# Patient Record
Sex: Female | Born: 1975 | Race: Black or African American | Hispanic: No | Marital: Married | Smoking: Never smoker
Health system: Southern US, Community
[De-identification: ages and names within clinical notes are randomized; demographics above are authoritative.]

## PROBLEM LIST (undated history)

## (undated) DIAGNOSIS — D696 Thrombocytopenia, unspecified: Secondary | ICD-10-CM

## (undated) DIAGNOSIS — A749 Chlamydial infection, unspecified: Secondary | ICD-10-CM

## (undated) DIAGNOSIS — A549 Gonococcal infection, unspecified: Secondary | ICD-10-CM

## (undated) DIAGNOSIS — F419 Anxiety disorder, unspecified: Secondary | ICD-10-CM

## (undated) DIAGNOSIS — A491 Streptococcal infection, unspecified site: Secondary | ICD-10-CM

## (undated) HISTORY — PX: WISDOM TOOTH EXTRACTION: SHX21

## (undated) HISTORY — DX: Streptococcal infection, unspecified site: A49.1

## (undated) HISTORY — PX: INGUINAL HERNIA REPAIR: SUR1180

---

## 1998-10-27 ENCOUNTER — Encounter (HOSPITAL_COMMUNITY): Admission: RE | Admit: 1998-10-27 | Discharge: 1999-01-25 | Payer: Self-pay | Admitting: Family Medicine

## 2009-07-21 ENCOUNTER — Ambulatory Visit: Payer: Self-pay | Admitting: Gynecology

## 2009-07-21 ENCOUNTER — Other Ambulatory Visit: Admission: RE | Admit: 2009-07-21 | Discharge: 2009-07-21 | Payer: Self-pay | Admitting: Gynecology

## 2010-06-19 DIAGNOSIS — A491 Streptococcal infection, unspecified site: Secondary | ICD-10-CM

## 2010-06-19 HISTORY — DX: Streptococcal infection, unspecified site: A49.1

## 2010-08-27 LAB — ABO/RH: RH Type: POSITIVE

## 2010-08-27 LAB — CBC
HCT: 39 % (ref 36–46)
Hemoglobin: 12.5 g/dL (ref 12.0–16.0)

## 2010-08-27 LAB — HIV ANTIBODY (ROUTINE TESTING W REFLEX): HIV: NONREACTIVE

## 2010-08-27 LAB — RPR: RPR: NONREACTIVE

## 2011-02-22 LAB — STREP B DNA PROBE: GBS: POSITIVE

## 2011-03-24 ENCOUNTER — Encounter (HOSPITAL_COMMUNITY): Payer: Self-pay | Admitting: *Deleted

## 2011-03-24 ENCOUNTER — Telehealth (HOSPITAL_COMMUNITY): Payer: Self-pay | Admitting: *Deleted

## 2011-03-24 ENCOUNTER — Inpatient Hospital Stay (HOSPITAL_COMMUNITY)
Admission: AD | Admit: 2011-03-24 | Discharge: 2011-03-24 | Disposition: A | Discharge status: Home / Self Care | Payer: BC Managed Care – PPO | Source: Ambulatory Visit | Attending: Obstetrics and Gynecology | Admitting: Obstetrics and Gynecology

## 2011-03-24 DIAGNOSIS — D696 Thrombocytopenia, unspecified: Secondary | ICD-10-CM

## 2011-03-24 DIAGNOSIS — O99820 Streptococcus B carrier state complicating pregnancy: Secondary | ICD-10-CM

## 2011-03-24 DIAGNOSIS — Z22330 Carrier of Group B streptococcus: Secondary | ICD-10-CM | POA: Insufficient documentation

## 2011-03-24 DIAGNOSIS — R030 Elevated blood-pressure reading, without diagnosis of hypertension: Secondary | ICD-10-CM | POA: Insufficient documentation

## 2011-03-24 DIAGNOSIS — O99891 Other specified diseases and conditions complicating pregnancy: Secondary | ICD-10-CM | POA: Insufficient documentation

## 2011-03-24 HISTORY — DX: Gonococcal infection, unspecified: A54.9

## 2011-03-24 HISTORY — DX: Chlamydial infection, unspecified: A74.9

## 2011-03-24 HISTORY — DX: Anxiety disorder, unspecified: F41.9

## 2011-03-24 LAB — POCT FERN TEST: Fern Test: NEGATIVE

## 2011-03-24 NOTE — ED Provider Notes (Signed)
History   35 yo G1P0 at 40 2/7 weeks presented c/o one episode wetness upon awakening this am.  Denies current leaking or bleeding, no recent IC.  No UCs, reports +FM.  Denies HA, visual symptoms, epigastric pain.  Is scheduled for induction on 10/10 in the am (no other slots available for cervical ripening).  Pregnancy remarkable for: + GBS Hx mild thrombocytopenia--platelets 129 at NOB, to nadir of 108 at 29 weeks.  Most recently 116 on 10/2, stable at 110-116 in last 4 weeks. 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 Labor Eval     OB History    Grav Para Term Preterm Abortions TAB SAB Ect Mult Living   1               Past Medical History  Diagnosis Date  . Chlamydia   . Gonorrhea   . Asthma     Seasonal, exercise induced  . Anxiety     Past Surgical History  Procedure Date  . Inguinal hernia repair   . Wisdom tooth extraction    FHx:  MGF heart disease; Mother/father HTN; Cousin Asthma; MGF, PGF, PAs, PUs diabetes; Cousin epilepsy and migraines; MGF prostate ca; PA anxiety; FH etoh abuse.  History  Substance Use Topics  . Smoking status: Never Smoker   . Smokeless tobacco: Never Used  . Alcohol Use: No    Allergies: Phenergan--dyskinesia  Prescriptions prior to admission  Medication Sig Dispense Refill  . ALBUTEROL SULFATE IN Inhale 2 puffs into the lungs daily as needed. For shortness of breath.       . DiphenhydrAMINE HCl (BENADRYL PO) Take 1 tablet by mouth daily as needed. For insomnia.       Marland Kitchen levocetirizine (XYZAL) 5 MG tablet Take 5 mg by mouth every evening.        . prenatal vitamin w/FE, FA (PRENATAL 1 + 1) 27-1 MG TABS Take 1 tablet by mouth daily.          ROS:  Denies SOB, chest pain, HA, epigastric pain, contractions, bleeding.   Physical Exam   Blood pressure 129/89, pulse 102, temperature 98.2 F (36.8 C), temperature source Oral, resp. rate 20, height 6\' 1"  (1.854 m), weight 101.606  kg (224 lb).  Chest clear Heart RRR without murmur Abd gravid, NT, no epigastric pain Pelvic--no pooling, small amount creamy vaginal discharge in vault.  Fern negative.             Cervix posterior, FT, 50%, vtx, -2. Amnisure obtained. Ext--DTR 1+ without clonus, tr edema FHR reactive, no decels Occasional mild irritability  ED Course  IUP at 40 2/7 weeks R/O SROM Mild elevation of BP x 1 on arrival. Hx mild thrombocytopenia througout pregnancy Unfavorable cervix + GBS  Plan: Await amnisure. Serial BPs.  Nigel Bridgeman, CNM 03/24/11 1150

## 2011-03-24 NOTE — Telephone Encounter (Signed)
Preadmission screen  

## 2011-03-24 NOTE — ED Provider Notes (Signed)
Amnisure negative. FHR reactive in cycles. No UCs. BP 122/78.  Re-scheduled induction for now-available evening slot on Monday, March 27, 2011, at 7:30 pm for cervical ripening.  Labor precautions reviewed. Induction process discussed.  D/C'd home. Will follow-up as scheduled or prn.  Nigel Bridgeman, CNM 03/24/11 1215

## 2011-03-24 NOTE — Progress Notes (Signed)
Woke up this morning with wet underwear and shorts. Has not had any since.

## 2011-03-27 ENCOUNTER — Other Ambulatory Visit: Payer: Self-pay | Admitting: Obstetrics and Gynecology

## 2011-03-27 ENCOUNTER — Encounter (HOSPITAL_COMMUNITY): Payer: Self-pay

## 2011-03-27 ENCOUNTER — Inpatient Hospital Stay (HOSPITAL_COMMUNITY)
Admission: AD | Admit: 2011-03-27 | Discharge: 2011-03-30 | DRG: 373 | Disposition: A | Discharge status: Home / Self Care | Payer: BC Managed Care – PPO | Source: Ambulatory Visit | Attending: Obstetrics and Gynecology | Admitting: Obstetrics and Gynecology

## 2011-03-27 ENCOUNTER — Encounter (HOSPITAL_COMMUNITY): Payer: Self-pay | Admitting: *Deleted

## 2011-03-27 DIAGNOSIS — Z22330 Carrier of Group B streptococcus: Secondary | ICD-10-CM

## 2011-03-27 DIAGNOSIS — O480 Post-term pregnancy: Principal | ICD-10-CM | POA: Diagnosis present

## 2011-03-27 DIAGNOSIS — D689 Coagulation defect, unspecified: Secondary | ICD-10-CM | POA: Diagnosis present

## 2011-03-27 DIAGNOSIS — D696 Thrombocytopenia, unspecified: Secondary | ICD-10-CM

## 2011-03-27 DIAGNOSIS — O99892 Other specified diseases and conditions complicating childbirth: Secondary | ICD-10-CM | POA: Diagnosis present

## 2011-03-27 DIAGNOSIS — O99820 Streptococcus B carrier state complicating pregnancy: Secondary | ICD-10-CM

## 2011-03-27 HISTORY — DX: Thrombocytopenia, unspecified: D69.6

## 2011-03-27 LAB — CBC
MCH: 33 pg (ref 26.0–34.0)
MCHC: 34.6 g/dL (ref 30.0–36.0)
Platelets: 117 10*3/uL — ABNORMAL LOW (ref 150–400)
RDW: 13.3 % (ref 11.5–15.5)

## 2011-03-27 LAB — COMPREHENSIVE METABOLIC PANEL
ALT: 21 U/L (ref 0–35)
AST: 58 U/L — ABNORMAL HIGH (ref 0–37)
Albumin: 3.1 g/dL — ABNORMAL LOW (ref 3.5–5.2)
Calcium: 10.1 mg/dL (ref 8.4–10.5)
GFR calc Af Amer: >90 mL/min (ref >90)
Sodium: 134 mEq/L — ABNORMAL LOW (ref 135–145)
Total Protein: 6.5 g/dL (ref 6.0–8.3)

## 2011-03-27 LAB — URIC ACID: Uric Acid, Serum: 4.2 mg/dL (ref 2.4–7.0)

## 2011-03-27 MED ORDER — SODIUM CHLORIDE 0.9 % IJ SOLN: 3.0000 mL | Freq: Two times a day (BID) | INTRAMUSCULAR | Status: DC

## 2011-03-27 MED ORDER — NALBUPHINE SYRINGE 5 MG/0.5 ML
10.0000 mg | INJECTION | INTRAMUSCULAR | Status: DC | PRN
  Administered 2011-03-28: 10 mg via INTRAVENOUS
  Filled 2011-03-27 (×2): qty 1

## 2011-03-27 MED ORDER — PENICILLIN G POTASSIUM 5000000 UNITS IJ SOLR
2.5000 10*6.[IU] | INTRAVENOUS | Status: DC
  Administered 2011-03-28 (×2): 2.5 10*6.[IU] via INTRAVENOUS
  Filled 2011-03-27 (×8): qty 2.5

## 2011-03-27 MED ORDER — LIDOCAINE HCL (PF) 1 % IJ SOLN
30.0000 mL | INTRAMUSCULAR | Status: DC | PRN
  Filled 2011-03-27 (×2): qty 30

## 2011-03-27 MED ORDER — FLEET ENEMA 7-19 GM/118ML RE ENEM: 1.0000 | ENEMA | RECTAL | Status: DC | PRN

## 2011-03-27 MED ORDER — ONDANSETRON HCL 4 MG/2ML IJ SOLN
4.0000 mg | Freq: Four times a day (QID) | INTRAMUSCULAR | Status: DC | PRN
Start: 2011-03-27 — End: 2011-03-29

## 2011-03-27 MED ORDER — IBUPROFEN 600 MG PO TABS
600.0000 mg | ORAL_TABLET | Freq: Four times a day (QID) | ORAL | Status: DC | PRN
  Administered 2011-03-28: 600 mg via ORAL
  Filled 2011-03-27 (×3): qty 1

## 2011-03-27 MED ORDER — OXYCODONE-ACETAMINOPHEN 5-325 MG PO TABS: 2.0000 | ORAL_TABLET | ORAL | Status: DC | PRN

## 2011-03-27 MED ORDER — DINOPROSTONE 10 MG VA INST
10.0000 mg | VAGINAL_INSERT | Freq: Once | VAGINAL | Status: AC
  Administered 2011-03-27: 10 mg via VAGINAL
  Filled 2011-03-27: qty 1

## 2011-03-27 MED ORDER — DIPHENHYDRAMINE HCL 25 MG PO CAPS
50.0000 mg | ORAL_CAPSULE | Freq: Every evening | ORAL | Status: DC | PRN
  Administered 2011-03-27: 50 mg via ORAL
  Filled 2011-03-27: qty 2

## 2011-03-27 MED ORDER — PENICILLIN G POTASSIUM 5000000 UNITS IJ SOLR
5.0000 10*6.[IU] | Freq: Once | INTRAVENOUS | Status: AC
  Administered 2011-03-28: 5 10*6.[IU] via INTRAVENOUS
  Filled 2011-03-27: qty 5

## 2011-03-27 MED ORDER — CITRIC ACID-SODIUM CITRATE 334-500 MG/5ML PO SOLN: 30.0000 mL | ORAL | Status: DC | PRN

## 2011-03-27 MED ORDER — OXYTOCIN 20 UNITS IN LACTATED RINGERS INFUSION - SIMPLE
1.0000 m[IU]/min | INTRAVENOUS | Status: DC
  Filled 2011-03-27: qty 1000

## 2011-03-27 MED ORDER — OXYTOCIN BOLUS FROM INFUSION
500.0000 mL | Freq: Once | INTRAVENOUS | Status: DC
  Filled 2011-03-27: qty 500

## 2011-03-27 MED ORDER — ACETAMINOPHEN 325 MG PO TABS: 650.0000 mg | ORAL_TABLET | ORAL | Status: DC | PRN

## 2011-03-27 MED ORDER — SODIUM CHLORIDE 0.9 % IV SOLN: 250.0000 mL | INTRAVENOUS | Status: DC

## 2011-03-27 MED ORDER — OXYTOCIN 20 UNITS IN LACTATED RINGERS INFUSION - SIMPLE
125.0000 mL/h | Freq: Once | INTRAVENOUS | Status: AC
  Administered 2011-03-28: 999 mL/h via INTRAVENOUS

## 2011-03-27 MED ORDER — SODIUM CHLORIDE 0.9 % IJ SOLN: 3.0000 mL | INTRAMUSCULAR | Status: DC | PRN

## 2011-03-27 MED ORDER — LACTATED RINGERS IV SOLN
500.0000 mL | INTRAVENOUS | Status: DC | PRN
  Administered 2011-03-27: 300 mL via INTRAVENOUS
  Administered 2011-03-27: 1000 mL via INTRAVENOUS
  Administered 2011-03-28: 500 mL via INTRAVENOUS

## 2011-03-27 MED ORDER — TERBUTALINE SULFATE 1 MG/ML IJ SOLN: 0.2500 mg | Freq: Once | INTRAMUSCULAR | Status: AC | PRN

## 2011-03-27 MED ORDER — ZOLPIDEM TARTRATE 10 MG PO TABS: 10.0000 mg | ORAL_TABLET | Freq: Every evening | ORAL | Status: DC | PRN

## 2011-03-27 NOTE — H&P (Signed)
Melanie House is a 35 y.o. female G1P0, at 64 5/7 weeks, presenting for induction due to postdates and mild thrombocytopenia, GBS positive.  Denies leaking, reports small amount pink d/c tonight with wiping, reports + FM.    Pregnancy remarkable for: Positive GBS Mild thrombocytopenia Hx STDs in past Seasonal Asthma Anxiety Phenergan allergy  History Entered care at 10 weeks.  Had normal 1st trimester screen and AFP.  Normal [place] at 18 weeks , with anterior placenta.  Platelet count slightly low at NOB, repeated at 28 weeks=113.  Followed after that at nearly each visit, with 108 as nadir at 29 weeks.  Last value was 116 on 10/2.  Evaluated 10/5 for ?ROM, with negative amnisure. OB History    Grav Para Term Preterm Abortions TAB SAB Ect Mult Living   1              Past Medical History  Diagnosis Date  . Chlamydia   . Gonorrhea   . Anxiety   . Asthma    Past Surgical History  Procedure Date  . Inguinal hernia repair   . Wisdom tooth extraction    Family History: family history includes Alcohol abuse in her maternal aunt, maternal grandfather, maternal uncle, and paternal grandfather; Asthma in her cousin; Cancer in her maternal grandfather; Diabetes in her maternal grandmother, paternal aunt, paternal grandfather, and paternal uncle; Heart disease in her maternal grandfather; Hypertension in her father and mother; Migraines in her cousin; and Seizures in her cousin.  There is no history of Anesthesia problems, and Hypotension, and Malignant hyperthermia, and Pseudochol deficiency, . Social History:  reports that she has never smoked. She has never used smokeless tobacco. She reports that she does not drink alcohol or use illicit drugs.  FOB is involved and supportive Marcie Bal), with a high-school education and employed in customer service.  Patient is graduate educated, and employed in Research scientist (physical sciences).  She is African-American and of the [place] faith.  She has been  followed by the CNM service.  Review of Systems  Constitutional: Negative.   HENT: Negative.   Eyes: Negative.   Respiratory: Negative.   Cardiovascular: Negative.   Gastrointestinal: Negative.   Genitourinary: Negative.   Musculoskeletal: Negative.   Skin: Negative.   Neurological: Negative.   Endo/Heme/Allergies: Negative.   Psychiatric/Behavioral: Negative.     Dilation: Fingertip Effacement (%): 50 Station: -1 Exam by:: Nigel Bridgeman, CNM Height 6\' 1"  (1.854 m), weight 102.513 kg (226 lb). Maternal Exam:  Uterine Assessment: Contraction strength is mild.  Contraction frequency is rare.   Abdomen: Patient reports no abdominal tenderness. Fundal height is 39 cm.   Estimated fetal weight is 8-8 1/2 obs.   Fetal presentation: vertex  Introitus: Normal vulva. Normal vagina.    Physical Exam  Constitutional: She is oriented to person, place, and time. She appears well-developed and well-nourished.  HENT:  Head: Normocephalic.  Eyes: Pupils are equal, round, and reactive to light.  Neck: Normal range of motion. Neck supple.  Cardiovascular: Normal rate and regular rhythm.   Respiratory: Effort normal and breath sounds normal.  GI: Soft. Bowel sounds are normal.  Genitourinary: Vagina normal and uterus normal.  Musculoskeletal: Normal range of motion.  Neurological: She is alert and oriented to person, place, and time.  Skin: Skin is warm and dry.  Psychiatric: She has a normal mood and affect. Her behavior is normal. Judgment and thought content normal.    Prenatal labs: ABO, Rh: O/Positive/-- (03/10 0000) Antibody: Negative (03/10  0000) Rubella: Immune (03/10 0000) RPR: Nonreactive (03/10 0000)  HBsAg: Negative (03/10 0000)  HIV: Non-reactive (03/10 0000)  GBS: Positive (09/05 0000)  1st trimester screen and AFP WNL Glucola WNL See above notes for platelet count Hgb12.8 at 28 weeks GBS positive.  Assessment/Plan: IUP at 40 5/7 weeks Mild  thrombocytopenia GBS positive  Plan: Admit to [facility] per consult with Dr. Normand Sloop Routine CNM orders Plan Cervidil, then pitocin in the am GBS Rx with PCN G per standard regimen, initiate with pitocin or if spontaneous labor ensues before that. Patient plans epidural Await platelet count.   Nigel Bridgeman 03/27/2011, 9:38 PM

## 2011-03-27 NOTE — Progress Notes (Signed)
Comfortable--unaware of any contractions. FHR reassuring, not clearly reactive. Cervidil placed in posterior fornix  Results for orders placed during the hospital encounter of 03/27/11 (from the past 24 hour(s))  CBC     Status: Abnormal (Preliminary result)   Collection Time   03/27/11  9:25 PM      Component Value Range   WBC 6.6  4.0 - 10.5 (K/uL)   RBC 4.61  3.87 - 5.11 (MIL/uL)   Hemoglobin 15.2 (*) 12.0 - 15.0 (g/dL)   HCT 11.9  14.7 - 82.9 (%)   MCV 95.2  78.0 - 100.0 (fL)   MCH 33.0  26.0 - 34.0 (pg)   MCHC 34.6  30.0 - 36.0 (g/dL)   RDW 56.2  13.0 - 86.5 (%)   Platelets PENDING  150 - 400 (K/uL)   Will await platelets. Patient prefers Benadryl to Ambien.  Nigel Bridgeman, CNM 03/27/11 2210

## 2011-03-28 ENCOUNTER — Encounter (HOSPITAL_COMMUNITY): Payer: Self-pay | Admitting: Anesthesiology

## 2011-03-28 ENCOUNTER — Inpatient Hospital Stay (HOSPITAL_COMMUNITY): Payer: BC Managed Care – PPO | Admitting: Anesthesiology

## 2011-03-28 ENCOUNTER — Encounter (HOSPITAL_COMMUNITY): Payer: Self-pay

## 2011-03-28 LAB — CBC
HCT: 41 % (ref 36.0–46.0)
HCT: 37.9 % (ref 36.0–46.0)
Hemoglobin: 14.1 g/dL (ref 12.0–15.0)
MCH: 32.6 pg (ref 26.0–34.0)
MCH: 32.8 pg (ref 26.0–34.0)
MCHC: 34.4 g/dL (ref 30.0–36.0)
MCHC: 34.8 g/dL (ref 30.0–36.0)
MCV: 94.9 fL (ref 78.0–100.0)
MCV: 94 fL (ref 78.0–100.0)
Platelets: 106 10*3/uL — ABNORMAL LOW (ref 150–400)
RBC: 4.32 MIL/uL (ref 3.87–5.11)
RDW: 13 % (ref 11.5–15.5)
WBC: 12.1 10*3/uL — ABNORMAL HIGH (ref 4.0–10.5)

## 2011-03-28 MED ORDER — FENTANYL 2.5 MCG/ML BUPIVACAINE 1/10 % EPIDURAL INFUSION (WH - ANES)
14.0000 mL/h | INTRAMUSCULAR | Status: DC
  Administered 2011-03-28 (×2): 14 mL/h via EPIDURAL
  Filled 2011-03-28 (×3): qty 60

## 2011-03-28 MED ORDER — ZOLPIDEM TARTRATE 5 MG PO TABS: 5.0000 mg | ORAL_TABLET | Freq: Every evening | ORAL | Status: DC | PRN

## 2011-03-28 MED ORDER — EPHEDRINE 5 MG/ML INJ
10.0000 mg | INTRAVENOUS | Status: DC | PRN
  Filled 2011-03-28 (×2): qty 4

## 2011-03-28 MED ORDER — PRENATAL PLUS 27-1 MG PO TABS
1.0000 | ORAL_TABLET | Freq: Every day | ORAL | Status: DC
  Administered 2011-03-29 – 2011-03-30 (×2): 1 via ORAL
  Filled 2011-03-28 (×2): qty 1

## 2011-03-28 MED ORDER — METHYLERGONOVINE MALEATE 0.2 MG PO TABS: 0.2000 mg | ORAL_TABLET | ORAL | Status: DC | PRN

## 2011-03-28 MED ORDER — LACTATED RINGERS IV SOLN: 500.0000 mL | Freq: Once | INTRAVENOUS | Status: DC

## 2011-03-28 MED ORDER — LORATADINE 10 MG PO TABS
10.0000 mg | ORAL_TABLET | Freq: Every evening | ORAL | Status: DC
  Administered 2011-03-28: 10 mg via ORAL
  Filled 2011-03-28: qty 1

## 2011-03-28 MED ORDER — IBUPROFEN 600 MG PO TABS
600.0000 mg | ORAL_TABLET | Freq: Four times a day (QID) | ORAL | Status: DC
  Administered 2011-03-29 – 2011-03-30 (×6): 600 mg via ORAL
  Filled 2011-03-28 (×4): qty 1

## 2011-03-28 MED ORDER — DIPHENHYDRAMINE HCL 25 MG PO CAPS: 25.0000 mg | ORAL_CAPSULE | Freq: Four times a day (QID) | ORAL | Status: DC | PRN

## 2011-03-28 MED ORDER — PHENYLEPHRINE 40 MCG/ML (10ML) SYRINGE FOR IV PUSH (FOR BLOOD PRESSURE SUPPORT)
80.0000 ug | PREFILLED_SYRINGE | INTRAVENOUS | Status: DC | PRN
  Filled 2011-03-28 (×2): qty 5

## 2011-03-28 MED ORDER — POLYSACCHARIDE IRON 150 MG PO CAPS
150.0000 mg | ORAL_CAPSULE | Freq: Two times a day (BID) | ORAL | Status: DC
  Administered 2011-03-29 – 2011-03-30 (×3): 150 mg via ORAL
  Filled 2011-03-28 (×5): qty 1

## 2011-03-28 MED ORDER — DIBUCAINE 1 % RE OINT: 1.0000 "application " | TOPICAL_OINTMENT | RECTAL | Status: DC | PRN

## 2011-03-28 MED ORDER — EPHEDRINE 5 MG/ML INJ
10.0000 mg | INTRAVENOUS | Status: DC | PRN
  Filled 2011-03-28: qty 4

## 2011-03-28 MED ORDER — OXYCODONE-ACETAMINOPHEN 5-325 MG PO TABS: 1.0000 | ORAL_TABLET | ORAL | Status: DC | PRN

## 2011-03-28 MED ORDER — OXYTOCIN 20 UNITS IN LACTATED RINGERS INFUSION - SIMPLE
1.0000 m[IU]/min | INTRAVENOUS | Status: DC
  Administered 2011-03-28: 1 m[IU]/min via INTRAVENOUS
  Filled 2011-03-28: qty 1000

## 2011-03-28 MED ORDER — SENNOSIDES-DOCUSATE SODIUM 8.6-50 MG PO TABS
2.0000 | ORAL_TABLET | Freq: Every day | ORAL | Status: DC
  Administered 2011-03-28 – 2011-03-29 (×2): 2 via ORAL

## 2011-03-28 MED ORDER — TETANUS-DIPHTH-ACELL PERTUSSIS 5-2.5-18.5 LF-MCG/0.5 IM SUSP
0.5000 mL | Freq: Once | INTRAMUSCULAR | Status: AC
  Administered 2011-03-29: 0.5 mL via INTRAMUSCULAR
  Filled 2011-03-28: qty 0.5

## 2011-03-28 MED ORDER — MAGNESIUM HYDROXIDE 400 MG/5ML PO SUSP: 30.0000 mL | ORAL | Status: DC | PRN

## 2011-03-28 MED ORDER — PHENYLEPHRINE 40 MCG/ML (10ML) SYRINGE FOR IV PUSH (FOR BLOOD PRESSURE SUPPORT)
80.0000 ug | PREFILLED_SYRINGE | INTRAVENOUS | Status: DC | PRN
  Filled 2011-03-28: qty 5

## 2011-03-28 MED ORDER — SIMETHICONE 80 MG PO CHEW: 80.0000 mg | CHEWABLE_TABLET | ORAL | Status: DC | PRN

## 2011-03-28 MED ORDER — FENTANYL 2.5 MCG/ML BUPIVACAINE 1/10 % EPIDURAL INFUSION (WH - ANES)
INTRAMUSCULAR | Status: DC | PRN
  Administered 2011-03-28: 14 mL/h via EPIDURAL

## 2011-03-28 MED ORDER — ONDANSETRON HCL 4 MG PO TABS: 4.0000 mg | ORAL_TABLET | ORAL | Status: DC | PRN

## 2011-03-28 MED ORDER — METHYLERGONOVINE MALEATE 0.2 MG/ML IJ SOLN: 0.2000 mg | INTRAMUSCULAR | Status: DC | PRN

## 2011-03-28 MED ORDER — TERBUTALINE SULFATE 1 MG/ML IJ SOLN: 0.2500 mg | Freq: Once | INTRAMUSCULAR | Status: AC | PRN

## 2011-03-28 MED ORDER — LEVOCETIRIZINE DIHYDROCHLORIDE 5 MG PO TABS
5.0000 mg | ORAL_TABLET | Freq: Every evening | ORAL | Status: DC
Start: 2011-03-28 — End: 2011-03-28

## 2011-03-28 MED ORDER — ONDANSETRON HCL 4 MG/2ML IJ SOLN: 4.0000 mg | INTRAMUSCULAR | Status: DC | PRN

## 2011-03-28 MED ORDER — PRENATAL PLUS 27-1 MG PO TABS: 1.0000 | ORAL_TABLET | Freq: Every day | ORAL | Status: DC

## 2011-03-28 MED ORDER — LACTATED RINGERS IV SOLN
INTRAVENOUS | Status: DC
  Administered 2011-03-28 (×2): via INTRAVENOUS

## 2011-03-28 MED ORDER — BENZOCAINE-MENTHOL 20-0.5 % EX AERO
1.0000 "application " | INHALATION_SPRAY | CUTANEOUS | Status: DC | PRN
  Administered 2011-03-29: 1 via TOPICAL

## 2011-03-28 MED ORDER — DIPHENHYDRAMINE HCL 50 MG/ML IJ SOLN: 12.5000 mg | INTRAMUSCULAR | Status: DC | PRN

## 2011-03-28 MED ORDER — WITCH HAZEL-GLYCERIN EX PADS: 1.0000 "application " | MEDICATED_PAD | CUTANEOUS | Status: DC | PRN

## 2011-03-28 MED ORDER — LANOLIN HYDROUS EX OINT: TOPICAL_OINTMENT | CUTANEOUS | Status: DC | PRN

## 2011-03-28 MED ORDER — MEDROXYPROGESTERONE ACETATE 150 MG/ML IM SUSP: 150.0000 mg | INTRAMUSCULAR | Status: DC | PRN

## 2011-03-28 NOTE — Progress Notes (Signed)
Subjective: Pt w/o c/o's.  Asked to be checked again.  Several family members at [facility].  Pitocin d/c'd around 0945 for lates; restarted over last hr for spacing of ctxs.    Objective: BP 143/74  Pulse 103  Temp(Src) 98.5 F (36.9 C) (Oral)  Resp 18  Ht 6\' 1"  (1.854 m)  Wt 102.513 kg (226 lb)  BMI 29.82 kg/m2  SpO2 98%   Total I/O In: -  Out: 150 [Urine:150]  FHT:  FHR: 140 bpm, variability: moderate,  accelerations:  Abscent,  decelerations:  Absent UC:   irregular, every 3-5 minutes SVE:   Dilation: 10 Effacement (%): 100 Station: +1 Exam by:: [employer], CNM AROM mod amt clear fluid; sm-mod amt bloody show.   Labs: Lab Results  Component Value Date   WBC 7.8 03/28/2011   HGB 14.1 03/28/2011   HCT 41.0 03/28/2011   MCV 94.9 03/28/2011   PLT 99* 03/28/2011    Assessment / Plan: Begin 2nd stage  Labor: Progressing normally Preeclampsia:  no signs or symptoms of toxicity Fetal Wellbeing:  Category I Pain Control:  Epidural I/D:  n/a Anticipated MOD:  NSVD Will labor down; or begin pushing w/ urge.   C/w MD prn.  Sojourner Behringer H 03/28/2011, 1:34 PM

## 2011-03-28 NOTE — Progress Notes (Signed)
Delivery Note Labored down until around 1440 and began pushing.  Pitocin on 73mu/min and ctxs q 4-54min.  Pushing more difficult secondary to spaced ctxs, so Pitocin increased twice during pushing phase.  As station about +3, vaginal bleeding noted, moderate amt & laceration suspected.  However, as station progressed, FHR began to have variables with late component & O2 placed on mom and position tilted.  Pushed well to SVD at 4:10 PM,  a viable female "Dow Adolph" was delivered via Vaginal, Spontaneous Delivery (Presentation: Left Occiput Anterior).  APGAR: 9, 9; weight 7 lb 1.8 oz (3225 g).  LNC reduced over shoulders and body, but then noted looped around Newborn's Left calf.  Spontaneous cry as placed on mom's abdomen.  Cord doubly clamped & cut by FOB.   Placenta status: Intact, Spontaneous.  Cord: 3 vessels with the following complications: None.  Cord pH: n/a  Anesthesia: Epidural  Episiotomy: None Lacerations: 2nd degree & Lt sulcus Suture Repair: 3.0 monocryl x2 Est. Blood Loss (mL): >500 secondary to lacerations  CBC will be drawn before removing epidural catheter.  Will start pt on po iron supplement. Pt plans to BF.  Skin-to-skin initiated in L&D by pt and FOB.   I&O cath for approximately 200cc.  Mom to postpartum.  Baby to nursery-stable.  Kaicee Scarpino H 03/28/2011, 5:36 PM

## 2011-03-28 NOTE — Anesthesia Preprocedure Evaluation (Addendum)
Anesthesia Evaluation  Name, MR# and DOB Patient awake  General Assessment Comment  Reviewed: Allergy & Precautions, H&P , Patient's Chart, lab work & pertinent test results  Airway Mallampati: I TM Distance: >3 FB Neck ROM: full    Dental No notable dental hx.    Pulmonary asthma    Pulmonary exam normal       Cardiovascular     Neuro/Psych PSYCHIATRIC DISORDERS Anxiety Negative Neurological ROS     GI/Hepatic negative GI ROS Neg liver ROS    Endo/Other  Negative Endocrine ROS  Renal/GU negative Renal ROS     Musculoskeletal negative musculoskeletal ROS (+)   Abdominal Normal abdominal exam  (+)   Peds  Hematology negative hematology ROS (+)   Anesthesia Other Findings   Reproductive/Obstetrics (+) Pregnancy                           Anesthesia Physical Anesthesia Plan  ASA: II  Anesthesia Plan: Epidural   Post-op Pain Management:    Induction:   Airway Management Planned:   Additional Equipment:   Intra-op Plan:   Post-operative Plan:   Informed Consent: I have reviewed the patients History and Physical, chart, labs and discussed the procedure including the risks, benefits and alternatives for the proposed anesthesia with the patient or authorized representative who has indicated his/her understanding and acceptance.     Plan Discussed with:   Anesthesia Plan Comments:         Anesthesia Quick Evaluation

## 2011-03-28 NOTE — Progress Notes (Signed)
Lilliard, cnm notified that while pt was up to bathroom cervidil fell out, pt caught cervidil and put on paper towel, cnm also notified that pt would like something for pain, cnm says to check cervix and replace cervidil if not much cervical change and pt has nubain ordered which can be given for pain relief

## 2011-03-28 NOTE — Progress Notes (Signed)
Subjective: Comfortable s/p epidural; off & on sleep overnight.  IV infiltrated x2; Lt forearm very swollen.  Had only received about 1/2 of 1st bag of PCN before infiltrated.  Husband at [facility].  Objective: BP 139/81  Pulse 88  Temp(Src) 98.5 F (36.9 C) (Oral)  Resp 20  Ht 6\' 1"  (1.854 m)  Wt 102.513 kg (226 lb)  BMI 29.82 kg/m2  SpO2 100%      FHT:  FHR: 145 bpm, variability: moderate,  accelerations:  Abscent,  decelerations:  Present intermittent variables/ few lates recently UC:   irregular, every 2-6 minutes SVE:   Dilation: 3.5 Effacement (%): 80 Station: -1 Exam by:: Marsha Gundlach, CNM  Labs: Lab Results  Component Value Date   WBC 7.8 03/28/2011   HGB 14.1 03/28/2011   HCT 41.0 03/28/2011   MCV 94.9 03/28/2011   PLT 99* 03/28/2011    Assessment / Plan: Induction at 40.6; early active labor  Labor: Cervidil out few hrs ago Preeclampsia:  no signs or symptoms of toxicity Fetal Wellbeing:  Category II Pain Control:  Epidural I/D:  n/a Anticipated MOD:  NSVD RN to work on getting new IV site and proceed with PCN; start Pitocin thereafter as well.  Kavina Cantave H 03/28/2011, 7:38 AM

## 2011-03-28 NOTE — Anesthesia Procedure Notes (Signed)
Epidural Patient location during procedure: OB Start time: 03/28/2011 6:23 AM End time: 03/28/2011 6:28 AM Reason for block: procedure for pain  Staffing Anesthesiologist: Sandrea Hughs Performed by: anesthesiologist   Preanesthetic Checklist Completed: patient identified, site marked, surgical consent, pre-op evaluation, timeout performed, IV checked, risks and benefits discussed and monitors and equipment checked  Epidural Patient position: sitting Prep: site prepped and draped and DuraPrep Patient monitoring: continuous pulse ox and blood pressure Approach: midline Injection technique: LOR air  Needle:  Needle type: Tuohy  Needle gauge: 17 G Needle length: 9 cm Needle insertion depth: 6 cm Catheter type: closed end flexible Catheter size: 19 Gauge Catheter at skin depth: 12 cm Test dose: negative and 1.5% lidocaine  Assessment Events: blood not aspirated, injection not painful, no injection resistance, negative IV test and no paresthesia

## 2011-03-29 ENCOUNTER — Encounter (HOSPITAL_COMMUNITY): Payer: Self-pay

## 2011-03-29 ENCOUNTER — Inpatient Hospital Stay (HOSPITAL_COMMUNITY): Admission: RE | Admit: 2011-03-29 | Payer: Self-pay | Source: Ambulatory Visit

## 2011-03-29 LAB — CBC
Platelets: 92 10*3/uL — ABNORMAL LOW (ref 150–400)
RBC: 3.45 MIL/uL — ABNORMAL LOW (ref 3.87–5.11)
RDW: 13.3 % (ref 11.5–15.5)
WBC: 9.5 10*3/uL (ref 4.0–10.5)

## 2011-03-29 MED ORDER — BENZOCAINE-MENTHOL 20-0.5 % EX AERO
INHALATION_SPRAY | CUTANEOUS | Status: AC
  Administered 2011-03-29: 1 via TOPICAL
  Filled 2011-03-29: qty 56

## 2011-03-29 NOTE — Anesthesia Postprocedure Evaluation (Signed)
Anesthesia Post Note  Patient: Melanie House  Procedure(s) Performed: * No procedures listed *  Anesthesia type: Epidural  Patient location: Mother/Baby  Post pain: Pain level controlled  Post assessment: Post-op Vital signs reviewed  Last Vitals:  Filed Vitals:   03/29/11 0055  BP: 123/75  Pulse: 110  Temp: 99.4 F (37.4 C)  Resp: 20    Post vital signs: Reviewed  Level of consciousness: awake  Complications: No apparent anesthesia complications

## 2011-03-29 NOTE — Progress Notes (Signed)
Encounter addended by: Isabella Bowens on: 03/29/2011  1:16 PM<BR>     Documentation filed: Notes Section

## 2011-03-29 NOTE — Progress Notes (Signed)
UR chart review completed.  

## 2011-03-29 NOTE — Progress Notes (Signed)
Post Partum Day 1 Subjective: no complaints.  Doing well. Breast feeding.  Up ad lib without syncope or dizziness.  Considering Micronor for birth control.  Perineum sore, but tolerable.  Objective: Blood pressure 119/78, pulse 96, temperature 98.6 F (37 C), temperature source Oral, resp. rate 18, height 6\' 1"  (1.854 m), weight 102.513 kg (226 lb), SpO2 98.00%, unknown if currently breastfeeding.  Physical Exam:  General: alert Lochia: appropriate Uterine Fundus: firm Incision: healing well DVT Evaluation: Negative Homans', no evidence DVT.  Results for orders placed during the hospital encounter of 03/27/11 (from the past 24 hour(s))  CBC     Status: Abnormal   Collection Time   03/28/11  6:05 PM      Component Value Range   WBC 12.1 (*) 4.0 - 10.5 (K/uL)   RBC 4.03  3.87 - 5.11 (MIL/uL)   Hemoglobin 13.2  12.0 - 15.0 (g/dL)   HCT 40.9  81.1 - 91.4 (%)   MCV 94.0  78.0 - 100.0 (fL)   MCH 32.8  26.0 - 34.0 (pg)   MCHC 34.8  30.0 - 36.0 (g/dL)   RDW 78.2  95.6 - 21.3 (%)   Platelets 106 (*) 150 - 400 (K/uL)  CBC     Status: Abnormal   Collection Time   03/29/11  5:33 AM      Component Value Range   WBC 9.5  4.0 - 10.5 (K/uL)   RBC 3.45 (*) 3.87 - 5.11 (MIL/uL)   Hemoglobin 11.1 (*) 12.0 - 15.0 (g/dL)   HCT 08.6 (*) 57.8 - 46.0 (%)   MCV 95.4  78.0 - 100.0 (fL)   MCH 32.2  26.0 - 34.0 (pg)   MCHC 33.7  30.0 - 36.0 (g/dL)   RDW 46.9  62.9 - 52.8 (%)   Platelets 92 (*) 150 - 400 (K/uL)    Assessment/Plan: PP day 1 Thrombocytopenia  Plan: Continue current care Repeat CBC in am Anticipate d/c tomorrow. Sitz bath to patient.   LOS: 2 days   Melanie House 03/29/2011, 9:16 AM

## 2011-03-29 NOTE — Anesthesia Postprocedure Evaluation (Signed)
  Anesthesia Post-op Note  Patient: Melanie House  Procedure(s) Performed: * No procedures listed *  Patient Location: PACU and Mother/Baby  Anesthesia Type: Epidural  Level of Consciousness: awake, alert  and oriented  Airway and Oxygen Therapy: Patient Spontanous Breathing  Post-op Pain: none  Post-op Assessment: Post-op Vital signs reviewed  Post-op Vital Signs: Reviewed and stable  Complications: No apparent anesthesia complications

## 2011-03-30 LAB — CBC
HCT: 30.3 % — ABNORMAL LOW (ref 36.0–46.0)
Hemoglobin: 10.5 g/dL — ABNORMAL LOW (ref 12.0–15.0)
RBC: 3.15 MIL/uL — ABNORMAL LOW (ref 3.87–5.11)

## 2011-03-30 MED ORDER — CETIRIZINE HCL 10 MG PO CHEW: 10.0000 mg | CHEWABLE_TABLET | Freq: Every day | ORAL | Status: DC

## 2011-03-30 MED ORDER — IBUPROFEN 600 MG PO TABS: 600.0000 mg | ORAL_TABLET | Freq: Four times a day (QID) | ORAL | Status: AC | PRN

## 2011-03-30 MED ORDER — OXYCODONE-ACETAMINOPHEN 5-325 MG PO TABS: 1.0000 | ORAL_TABLET | ORAL | Status: AC | PRN

## 2011-03-30 MED ORDER — NORETHINDRONE 0.35 MG PO TABS: 1.0000 | ORAL_TABLET | Freq: Every day | ORAL | Status: DC

## 2011-03-30 NOTE — Progress Notes (Signed)
Post Partum Day 2 Subjective: no complaints. Ready for discharge.  Using Motrin for pain, occasionally using Percocet.  Breastfeeding going well.  Plans Micronor for contraception.  Objective: Blood pressure 113/74, pulse 77, temperature 98.7 F (37.1 C), temperature source Oral, resp. rate 18, height 6\' 1"  (1.854 m), weight 102.513 kg (226 lb), SpO2 99.00%, unknown if currently breastfeeding.  Physical Exam:  General: alert Lochia: appropriate Uterine Fundus: firm Incision: healing well DVT Evaluation: No evidence of DVT seen on physical exam. Negative Homan's sign.   Basename 03/30/11 0553 03/29/11 0533  HGB 10.5* 11.1*  HCT 30.3* 32.9*    Assessment/Plan: Discharge home Rx Motrin, Percocet, Micronor, Zyrtec (for medical spending account coverage) F/u in 6 weeks at [facility].   LOS: 3 days   Wah Sabic 03/30/2011, 10:11 AM

## 2011-03-30 NOTE — Discharge Summary (Signed)
Obstetric Discharge Summary Reason for Admission: induction of labor Prenatal Procedures: ultrasound Intrapartum Procedures: spontaneous vaginal delivery Postpartum Procedures: none Complications-Operative and Postpartum: 2nd degree perineal laceration, and sulcus tear Hemoglobin  Date Value Range Status  03/30/2011 10.5* 12.0-15.0 (g/dL) Final     HCT  Date Value Range Status  03/30/2011 30.3* 36.0-46.0 (%) Final    Discharge Diagnoses: Term Pregnancy-delivered                                         Thrombocytopenia  Discharge Information: Date: 03/30/2011 Activity: Per [facility] handout Diet: routine Medications: Ibuprofen, Percocet and Micronor, Zyrtek Condition: stable Instructions: refer to practice specific booklet Discharge to: home Follow-up Information    Follow up with [facility] in 6 weeks.         Newborn Data: Live born female  Birth Weight: 7 lb 1.8 oz (3225 g) APGAR: 9, 9  Home with mother.  Nigel Bridgeman 03/30/2011, 8:26 AM

## 2012-02-02 ENCOUNTER — Telehealth: Payer: Self-pay | Admitting: Obstetrics and Gynecology

## 2012-02-02 NOTE — Telephone Encounter (Signed)
Per pt's request approved rx refill for pnvs  Disp 30 sig 1 po qd with 3 refills . Pt aware of rx and voice understanding . bt cma

## 2012-02-02 NOTE — Telephone Encounter (Signed)
Spoke with pt rgd msg. Pt stated that she is requesting rx refill for pnvs . Called into [facility] on [address]. Pt's voice understanding . bt cma

## 2012-02-02 NOTE — Telephone Encounter (Signed)
TRIAGE/APPT [facility].

## 2012-06-19 NOTE — L&D Delivery Note (Signed)
Delivery Note At 3:59 AM a viable female was delivered via Vaginal, Spontaneous Delivery (Presentation: Left Occiput Anterior).  APGAR: 9, 9; weight pending.   Placenta status: Intact, Spontaneous.  Cord: 3 vessels with the following complications: None.  Cord pH: not collected  Anesthesia: Epidural  Episiotomy: None Lacerations: 1st degree Suture Repair: 3.0 vicryl Est. Blood Loss (mL): 400  Mom to postpartum.  Baby to skin to skin immediately after delivery.  Routine PP orders Breastfeeding BC undecided at this time  Haroldine Laws 03/11/2013, 4:58 AM

## 2012-07-17 ENCOUNTER — Ambulatory Visit: Payer: BC Managed Care – PPO | Admitting: Obstetrics and Gynecology

## 2012-07-17 DIAGNOSIS — Z331 Pregnant state, incidental: Secondary | ICD-10-CM

## 2012-07-17 LAB — OB RESULTS CONSOLE GC/CHLAMYDIA
Chlamydia: NEGATIVE
Gonorrhea: NEGATIVE

## 2012-07-17 LAB — POCT URINALYSIS DIPSTICK
Bilirubin, UA: NEGATIVE
Leukocytes, UA: NEGATIVE
Nitrite, UA: NEGATIVE
pH, UA: 7

## 2012-07-17 NOTE — Progress Notes (Signed)
NOB interview completed.  PNV samples given.  NOB work up scheduled for Thursday 08/22/12 w/ Alvino Chapel, CNM

## 2012-07-18 ENCOUNTER — Encounter: Payer: Self-pay | Admitting: Certified Nurse Midwife

## 2012-07-18 LAB — PRENATAL PANEL VII
Antibody Screen: NEGATIVE
Eosinophils Relative: 6 % — ABNORMAL HIGH (ref 0–5)
HCT: 40 % (ref 36.0–46.0)
Hemoglobin: 13.9 g/dL (ref 12.0–15.0)
Lymphocytes Relative: 38 % (ref 12–46)
Lymphs Abs: 1.9 10*3/uL (ref 0.7–4.0)
MCH: 30.6 pg (ref 26.0–34.0)
MCV: 88.1 fL (ref 78.0–100.0)
Monocytes Absolute: 0.4 10*3/uL (ref 0.1–1.0)
Monocytes Relative: 7 % (ref 3–12)
RBC: 4.54 MIL/uL (ref 3.87–5.11)
Rh Type: POSITIVE
Rubella: 1.69 Index — ABNORMAL HIGH (ref <0.90)
WBC: 5.1 10*3/uL (ref 4.0–10.5)

## 2012-07-18 LAB — CULTURE, OB URINE: Organism ID, Bacteria: NO GROWTH

## 2012-07-19 LAB — HEMOGLOBINOPATHY EVALUATION: Hgb S Quant: 0 %

## 2012-07-23 ENCOUNTER — Telehealth: Payer: Self-pay | Admitting: Obstetrics and Gynecology

## 2012-07-23 NOTE — Telephone Encounter (Signed)
Pt called, is 8 wks and wants to know if ok to use saline nasal spray.  Pt informed ok to use saline nasal spray or may even use a netty pot for her nasal stuffiness.  Pt voices agreement.

## 2012-08-22 ENCOUNTER — Ambulatory Visit: Payer: BC Managed Care – PPO | Admitting: Certified Nurse Midwife

## 2012-08-22 ENCOUNTER — Encounter: Payer: Self-pay | Admitting: Certified Nurse Midwife

## 2012-08-22 VITALS — BP 90/66 | Wt 183.0 lb

## 2012-08-22 NOTE — Progress Notes (Signed)
   Melanie House is being seen today for her first obstetrical visit at [redacted]w[redacted]d gestation by LMP.  She reports feeling well.  Pt reports increased discharge.  She has been told in the past she has persistent yeast infections that have not resolved despite treatment.  Her obstetrical history is significant for: Patient Active Problem List  Diagnosis  . H/o Thrombocytopenia  . AMA (advanced maternal age) multigravida 35+  . Promethazine/Phenergan Allergy    Relationship with FOB:  Married  She is not employed  Feeding plan:   Breastfeeding  Pregnancy history fully reviewed.  The following portions of the patient's history were reviewed and updated as appropriate: allergies, current medications, past family history, past medical history, past social history, past surgical history and problem list.  Review of Systems Pertinent ROS is described in HPI   Objective:   BP 90/66  Wt 183 lb (83.008 kg)  BMI 24.15 kg/m2  LMP 05/28/2012 Wt Readings from Last 1 Encounters:  08/22/12 183 lb (83.008 kg)   BMI: Body mass index is 24.15 kg/(m^2).  General: alert, cooperative and no distress HEENT: grossly normal  Thyroid: normal  Respiratory: clear to auscultation bilaterally Cardiovascular: regular rate and rhythm,  Breasts:  No dominant masses, nipples erect Gastrointestinal: soft, non-tender; no masses,  no organomegaly Extremities: extremities normal, no pain or edema Vaginal Bleeding: None  EXTERNAL GENITALIA: normal appearing vulva with no masses, tenderness or lesions VAGINA: no abnormal discharge or lesions CERVIX: no lesions or cervical motion tenderness; cervix closed, long, firm UTERUS: gravid and consistent with 12 weeks ADNEXA: no masses palpable and nontender OB EXAM PELVIMETRY: appears adequate  FHR:  160  bpm  Assessment:   Pregnancy at  12w 2d AMA H/o Thrombocytopenia  Plan:   Prenatal panel reviewed and discussed with the patient:  yes  Pap smear  collected:  Last pap 11/2010; due 11/2013 GC/Chlamydia collected:  At NOB interview Wet prep:  + clue and yeast OSOM BV: Pos OSOM Trich: Neg  Discussion of Genetic testing options: Desires Quad screen Prenatal vitamins recommended  Rx: Terazol 7 1 applicator HS x 7 days Metronidazole 500mg  PO BID x 7 days  Next visit:  4 weeks for ROB  Other anticipated f/u:   Anatomy U/S and Quad to be schedule at 18 weeks Repeat platelet labs in 2nd trimester    Haroldine Laws, CNM, MSN

## 2012-08-22 NOTE — Progress Notes (Signed)
[redacted]w[redacted]d Pt wants genetic screening.  Pt declines flu shot Pt requests rx for zyrtec  Last pap 08/26/10 WNL GC/CT neg 07/17/12 via urine

## 2013-01-23 ENCOUNTER — Telehealth: Payer: Self-pay | Admitting: Hematology and Oncology

## 2013-01-23 NOTE — Telephone Encounter (Signed)
LVOM FOR PT TO RETURN CALL IN RE TO NP APPT.  °

## 2013-01-24 ENCOUNTER — Telehealth: Payer: Self-pay | Admitting: Hematology and Oncology

## 2013-01-24 NOTE — Telephone Encounter (Signed)
LVOM FOR PT TO RETURN CALL IN RE TO REFERRAL.  °

## 2013-02-06 ENCOUNTER — Telehealth: Payer: Self-pay | Admitting: Hematology and Oncology

## 2013-02-06 LAB — OB RESULTS CONSOLE GBS: GBS: NEGATIVE

## 2013-02-06 NOTE — Telephone Encounter (Signed)
LVOM FOR MEL @ DR. HAYGOOD NOT ABLE TO REACH PT WITH NP APPT.

## 2013-03-10 ENCOUNTER — Telehealth (HOSPITAL_COMMUNITY): Payer: Self-pay | Admitting: *Deleted

## 2013-03-10 ENCOUNTER — Inpatient Hospital Stay (HOSPITAL_COMMUNITY)
Admission: AD | Admit: 2013-03-10 | Discharge: 2013-03-12 | DRG: 372 | Disposition: A | Discharge status: Home / Self Care | Payer: BC Managed Care – PPO | Source: Ambulatory Visit | Attending: Obstetrics and Gynecology | Admitting: Obstetrics and Gynecology

## 2013-03-10 ENCOUNTER — Encounter (HOSPITAL_COMMUNITY): Payer: Self-pay | Admitting: *Deleted

## 2013-03-10 DIAGNOSIS — O09529 Supervision of elderly multigravida, unspecified trimester: Secondary | ICD-10-CM | POA: Diagnosis present

## 2013-03-10 DIAGNOSIS — O480 Post-term pregnancy: Secondary | ICD-10-CM | POA: Diagnosis present

## 2013-03-10 DIAGNOSIS — O4100X0 Oligohydramnios, unspecified trimester, not applicable or unspecified: Secondary | ICD-10-CM | POA: Diagnosis present

## 2013-03-10 DIAGNOSIS — D689 Coagulation defect, unspecified: Secondary | ICD-10-CM | POA: Diagnosis not present

## 2013-03-10 DIAGNOSIS — D696 Thrombocytopenia, unspecified: Secondary | ICD-10-CM | POA: Diagnosis not present

## 2013-03-10 NOTE — Telephone Encounter (Signed)
Preadmission screen  

## 2013-03-10 NOTE — MAU Note (Signed)
Pt presents with complaint of contractions.  

## 2013-03-10 NOTE — MAU Note (Signed)
SAYS UC HURT  BAD SINCE 619PM.  .  IN OFFICE TODAY- 2 CM.Marland Kitchen    DENIES HSV AND MRSA

## 2013-03-11 ENCOUNTER — Encounter (HOSPITAL_COMMUNITY): Payer: Self-pay | Admitting: *Deleted

## 2013-03-11 ENCOUNTER — Inpatient Hospital Stay (HOSPITAL_COMMUNITY): Payer: BC Managed Care – PPO | Admitting: Anesthesiology

## 2013-03-11 ENCOUNTER — Inpatient Hospital Stay (HOSPITAL_COMMUNITY): Admission: RE | Admit: 2013-03-11 | Payer: BC Managed Care – PPO | Source: Ambulatory Visit

## 2013-03-11 ENCOUNTER — Encounter (HOSPITAL_COMMUNITY): Payer: Self-pay | Admitting: Anesthesiology

## 2013-03-11 DIAGNOSIS — K219 Gastro-esophageal reflux disease without esophagitis: Secondary | ICD-10-CM | POA: Insufficient documentation

## 2013-03-11 DIAGNOSIS — D696 Thrombocytopenia, unspecified: Secondary | ICD-10-CM | POA: Diagnosis present

## 2013-03-11 LAB — CBC
HCT: 37.6 % (ref 36.0–46.0)
HCT: 40.9 % (ref 36.0–46.0)
Hemoglobin: 12.7 g/dL (ref 12.0–15.0)
MCH: 31.3 pg (ref 26.0–34.0)
MCHC: 33.8 g/dL (ref 30.0–36.0)
MCHC: 34.5 g/dL (ref 30.0–36.0)
MCV: 92.5 fL (ref 78.0–100.0)
MCV: 92.6 fL (ref 78.0–100.0)
RDW: 13.3 % (ref 11.5–15.5)
WBC: 9.5 10*3/uL (ref 4.0–10.5)

## 2013-03-11 LAB — RPR: RPR Ser Ql: NONREACTIVE

## 2013-03-11 MED ORDER — ONDANSETRON HCL 4 MG PO TABS: 4.0000 mg | ORAL_TABLET | ORAL | Status: DC | PRN

## 2013-03-11 MED ORDER — PHENYLEPHRINE 40 MCG/ML (10ML) SYRINGE FOR IV PUSH (FOR BLOOD PRESSURE SUPPORT)
80.0000 ug | PREFILLED_SYRINGE | INTRAVENOUS | Status: DC | PRN
  Filled 2013-03-11: qty 2

## 2013-03-11 MED ORDER — DIPHENHYDRAMINE HCL 50 MG/ML IJ SOLN: 12.5000 mg | INTRAMUSCULAR | Status: DC | PRN

## 2013-03-11 MED ORDER — ONDANSETRON HCL 4 MG/2ML IJ SOLN: 4.0000 mg | INTRAMUSCULAR | Status: DC | PRN

## 2013-03-11 MED ORDER — IBUPROFEN 600 MG PO TABS: 600.0000 mg | ORAL_TABLET | Freq: Four times a day (QID) | ORAL | Status: DC | PRN

## 2013-03-11 MED ORDER — LACTATED RINGERS IV BOLUS (SEPSIS)
500.0000 mL | Freq: Once | INTRAVENOUS | Status: AC
  Administered 2013-03-11: 01:00:00 via INTRAVENOUS

## 2013-03-11 MED ORDER — LANOLIN HYDROUS EX OINT: TOPICAL_OINTMENT | CUTANEOUS | Status: DC | PRN

## 2013-03-11 MED ORDER — DIBUCAINE 1 % RE OINT: 1.0000 "application " | TOPICAL_OINTMENT | RECTAL | Status: DC | PRN

## 2013-03-11 MED ORDER — CITRIC ACID-SODIUM CITRATE 334-500 MG/5ML PO SOLN: 30.0000 mL | ORAL | Status: DC | PRN

## 2013-03-11 MED ORDER — OXYCODONE-ACETAMINOPHEN 5-325 MG PO TABS: 1.0000 | ORAL_TABLET | ORAL | Status: DC | PRN

## 2013-03-11 MED ORDER — MISOPROSTOL 200 MCG PO TABS
1000.0000 ug | ORAL_TABLET | Freq: Once | ORAL | Status: AC
  Administered 2013-03-11: 1000 ug via RECTAL

## 2013-03-11 MED ORDER — OXYTOCIN 40 UNITS IN LACTATED RINGERS INFUSION - SIMPLE MED
62.5000 mL/h | INTRAVENOUS | Status: DC
  Filled 2013-03-11 (×2): qty 1000

## 2013-03-11 MED ORDER — FENTANYL 2.5 MCG/ML BUPIVACAINE 1/10 % EPIDURAL INFUSION (WH - ANES)
14.0000 mL/h | INTRAMUSCULAR | Status: DC | PRN
  Administered 2013-03-11: 16 mL/h via EPIDURAL
  Filled 2013-03-11: qty 125

## 2013-03-11 MED ORDER — TETANUS-DIPHTH-ACELL PERTUSSIS 5-2.5-18.5 LF-MCG/0.5 IM SUSP
0.5000 mL | Freq: Once | INTRAMUSCULAR | Status: AC
  Administered 2013-03-12: 0.5 mL via INTRAMUSCULAR
  Filled 2013-03-11: qty 0.5

## 2013-03-11 MED ORDER — DIPHENHYDRAMINE HCL 25 MG PO CAPS: 25.0000 mg | ORAL_CAPSULE | Freq: Four times a day (QID) | ORAL | Status: DC | PRN

## 2013-03-11 MED ORDER — LACTATED RINGERS IV SOLN
500.0000 mL | Freq: Once | INTRAVENOUS | Status: AC
  Administered 2013-03-11: 1000 mL via INTRAVENOUS

## 2013-03-11 MED ORDER — PRENATAL MULTIVITAMIN CH
1.0000 | ORAL_TABLET | Freq: Every day | ORAL | Status: DC
  Administered 2013-03-11 – 2013-03-12 (×2): 1 via ORAL
  Filled 2013-03-11 (×2): qty 1

## 2013-03-11 MED ORDER — ACETAMINOPHEN 325 MG PO TABS: 650.0000 mg | ORAL_TABLET | ORAL | Status: DC | PRN

## 2013-03-11 MED ORDER — IBUPROFEN 600 MG PO TABS
600.0000 mg | ORAL_TABLET | Freq: Four times a day (QID) | ORAL | Status: DC
  Administered 2013-03-11 – 2013-03-12 (×5): 600 mg via ORAL
  Filled 2013-03-11 (×5): qty 1

## 2013-03-11 MED ORDER — ONDANSETRON HCL 4 MG/2ML IJ SOLN: 4.0000 mg | Freq: Four times a day (QID) | INTRAMUSCULAR | Status: DC | PRN

## 2013-03-11 MED ORDER — SENNOSIDES-DOCUSATE SODIUM 8.6-50 MG PO TABS: 2.0000 | ORAL_TABLET | ORAL | Status: DC

## 2013-03-11 MED ORDER — EPHEDRINE 5 MG/ML INJ
10.0000 mg | INTRAVENOUS | Status: DC | PRN
  Filled 2013-03-11: qty 2

## 2013-03-11 MED ORDER — MISOPROSTOL 200 MCG PO TABS
ORAL_TABLET | ORAL | Status: AC
  Filled 2013-03-11: qty 5

## 2013-03-11 MED ORDER — LACTATED RINGERS IV SOLN: INTRAVENOUS | Status: DC

## 2013-03-11 MED ORDER — WITCH HAZEL-GLYCERIN EX PADS: 1.0000 "application " | MEDICATED_PAD | CUTANEOUS | Status: DC | PRN

## 2013-03-11 MED ORDER — EPHEDRINE 5 MG/ML INJ
10.0000 mg | INTRAVENOUS | Status: DC | PRN
  Filled 2013-03-11: qty 4
  Filled 2013-03-11: qty 2

## 2013-03-11 MED ORDER — ZOLPIDEM TARTRATE 5 MG PO TABS: 5.0000 mg | ORAL_TABLET | Freq: Every evening | ORAL | Status: DC | PRN

## 2013-03-11 MED ORDER — SIMETHICONE 80 MG PO CHEW: 80.0000 mg | CHEWABLE_TABLET | ORAL | Status: DC | PRN

## 2013-03-11 MED ORDER — BENZOCAINE-MENTHOL 20-0.5 % EX AERO
1.0000 "application " | INHALATION_SPRAY | CUTANEOUS | Status: DC | PRN
  Administered 2013-03-11: 1 via TOPICAL
  Filled 2013-03-11: qty 56

## 2013-03-11 MED ORDER — OXYTOCIN BOLUS FROM INFUSION
500.0000 mL | INTRAVENOUS | Status: DC
  Administered 2013-03-11: 500 mL via INTRAVENOUS

## 2013-03-11 MED ORDER — PHENYLEPHRINE 40 MCG/ML (10ML) SYRINGE FOR IV PUSH (FOR BLOOD PRESSURE SUPPORT)
80.0000 ug | PREFILLED_SYRINGE | INTRAVENOUS | Status: DC | PRN
  Filled 2013-03-11: qty 5
  Filled 2013-03-11: qty 2

## 2013-03-11 MED ORDER — SODIUM BICARBONATE 8.4 % IV SOLN
INTRAVENOUS | Status: DC | PRN
  Administered 2013-03-11: 5 mL via EPIDURAL

## 2013-03-11 MED ORDER — LIDOCAINE HCL (PF) 1 % IJ SOLN
30.0000 mL | INTRAMUSCULAR | Status: DC | PRN
  Filled 2013-03-11 (×3): qty 30

## 2013-03-11 NOTE — Anesthesia Procedure Notes (Signed)

## 2013-03-11 NOTE — Anesthesia Preprocedure Evaluation (Signed)
Anesthesia Evaluation  Patient identified by MRN, date of birth, ID band Patient awake    Reviewed: Allergy & Precautions, H&P , Patient's Chart, lab work & pertinent test results  Airway Mallampati: II  TM Distance: >3 FB Neck ROM: full    Dental  (+) Teeth Intact   Pulmonary asthma ,    breath sounds clear to auscultation       Cardiovascular  Rhythm:regular Rate:Normal     Neuro/Psych    GI/Hepatic   Endo/Other    Renal/GU      Musculoskeletal   Abdominal   Peds  Hematology   Anesthesia Other Findings       Reproductive/Obstetrics (+) Pregnancy                             Anesthesia Physical Anesthesia Plan  ASA: II  Anesthesia Plan: Epidural   Post-op Pain Management:    Induction:   Airway Management Planned:   Additional Equipment:   Intra-op Plan:   Post-operative Plan:   Informed Consent: I have reviewed the patients History and Physical, chart, labs and discussed the procedure including the risks, benefits and alternatives for the proposed anesthesia with the patient or authorized representative who has indicated his/her understanding and acceptance.   Dental Advisory Given  Plan Discussed with:   Anesthesia Plan Comments: (Labs checked- platelets confirmed with RN in room. Fetal heart tracing, per RN, reported to be stable enough for sitting procedure. Discussed epidural, and patient consents to the procedure:  included risk of possible headache,backache, failed block, allergic reaction, and nerve injury. This patient was asked if she had any questions or concerns before the procedure started.)        Anesthesia Quick Evaluation  

## 2013-03-11 NOTE — Progress Notes (Signed)
Per Dr. Stefano Gaul, okay for pt to receive motrin with plt count of 99.

## 2013-03-11 NOTE — H&P (Signed)
Melanie House is a 37 y.o. female, G2P1001 at [redacted]w[redacted]d weeks, presenting for active labor.  UC started about 1730.  Denies LOF, recent fever, resp or GI c/o's, UTI or PIH s/s. GFM. Desires epidural.  Patient Active Problem List   Diagnosis Date Noted  . Thrombocytopenia, unspecified 03/11/2013  . GERD (gastroesophageal reflux disease) 03/11/2013  . Post-dates pregnancy 03/11/2013  . Abnormal chromosomal test 03/11/2013  . H/o Oligohydramnios this pregnancy 03/11/2013  . AMA (advanced maternal age) multigravida 35+ 07/18/2012  . Promethazine/Phenergan Allergy 07/18/2012  . H/o Thrombocytopenia 03/27/2011    History of present pregnancy: Patient entered care at 12 weeks.   EDC of 03/04/13 was established by LMP.   Anatomy scan (level II including DSR):  18 weeks, limited with normal findings and an anterior placenta.   Additional [place] evaluations:  Anatomy f/u at [redacted]w[redacted]d - anatomy scan complete.  [redacted]w[redacted]d for growth S<D - AFI 30th%ile, EFW 63rd%ile.  Growth / BPP for PD at [redacted]w[redacted]d AFI 30th%ile, EFW 86th%ile, 8/8 Significant prenatal events:  none   Last evaluation:  03/10/13 at [redacted]w[redacted]d  No cervical exam  OB History   Grav Para Term Preterm Abortions TAB SAB Ect Mult Living   2 1 1       1      Past Medical History  Diagnosis Date  . Chlamydia   . Gonorrhea   . Thrombocytopenia   . GBS (group B streptococcus) infection 2012    w/ first pregnancy  . Thrombocytopenia 2012  . Abnormal Pap smear     Rpt pap done;had taken folic acid  . Anxiety   . Asthma     allergy induced  . Infection     Yeast ;not frequent  . Infection     BV;not frequent  . Infection     UTI x 1   Past Surgical History  Procedure Laterality Date  . Wisdom tooth extraction    . Inguinal hernia repair     Family History: family history includes Alcohol abuse in her maternal aunt, maternal grandfather, maternal uncle, and paternal grandfather; Asthma in her cousin; Cancer in her maternal grandfather; Diabetes in her  maternal grandmother, paternal aunt, paternal grandfather, and paternal uncle; Heart disease in her maternal grandfather; Hypertension in her father and mother; Migraines in her cousin; Seizures in her cousin. Social History:  reports that she has never smoked. She has never used smokeless tobacco. She reports that she does not drink alcohol or use illicit drugs.   Prenatal Transfer Tool  Maternal Diabetes: No Genetic Screening: Abnormal:  Results: Elevated risk of Trisomy 76; Harmony neg Maternal Ultrasounds/Referrals: Normal Fetal Ultrasounds or other Referrals:  None Maternal Substance Abuse:  No Significant Maternal Medications:  None Significant Maternal Lab Results: Lab values include: Group B Strep negative    ROS: see HPI above, all other systems are negative  Allergies  Allergen Reactions  . Promethazine Other (See Comments)    Pt suffers from a stiff neck and can not close mouth after coming in contact with medication.     Dilation: 8 Effacement (%): 100 Station: +1 Exam by:: Nnaemeka Samson, CNM Blood pressure 143/80, pulse 74, temperature 99.2 F (37.3 C), temperature source Oral, resp. rate 18, height 6\' 1"  (1.854 m), weight 236 lb (107.049 kg), last menstrual period 05/28/2012, SpO2 100.00%.  Chest clear Heart RRR without murmur Abd gravid, NT Ext: WNL  FHR: Reactive NST UCs:  Q 5 min  Prenatal labs: ABO, Rh: O/POS/-- (01/29 1540) Antibody: NEG (01/29  1540) Rubella:   Immune RPR: NON REAC (01/29 1540)  HBsAg: NEGATIVE (01/29 1540)  HIV: NON REACTIVE (01/29 1540)  GBS: Negative (08/21 0000) Sickle cell/Hgb electrophoresis:  Normal Study Pap:  Last pap 11/2010 GC:  neg Chlamydia:  neg Genetic screenings:  Quad Pos for DS - Harmony neg Glucola:  120 Other:  n/a   Assessment/Plan: IUP at [redacted]w[redacted]d Active labor GBS neg Thrombocytopenia (95) Desires epidural  Admit to [facility] Epidural prn   Rowan Blase, MSN 03/11/2013, 2:32 AM

## 2013-03-12 LAB — CBC
Hemoglobin: 12.1 g/dL (ref 12.0–15.0)
MCH: 32.1 pg (ref 26.0–34.0)
MCHC: 34.2 g/dL (ref 30.0–36.0)
MCV: 93.9 fL (ref 78.0–100.0)
RDW: 13.6 % (ref 11.5–15.5)

## 2013-03-12 MED ORDER — IBUPROFEN 600 MG PO TABS: 600.0000 mg | ORAL_TABLET | Freq: Four times a day (QID) | ORAL | Status: DC

## 2013-03-12 NOTE — Discharge Instructions (Signed)
Thrombocytopenia Thrombocytopenia is a condition in which there is an abnormally small number of platelets in your blood. Platelets are also called thrombocytes. Platelets are needed for blood clotting. CAUSES Thrombocytopenia is caused by:   Decreased production of platelets. This can be caused by:  Aplastic anemia in which your bone marrow quits making blood cells.  Cancer in the bone marrow.  Use of certain medicines, including chemotherapy.  Infection in the bone marrow.  Heavy alcohol consumption.  Increased destruction of platelets. This can be caused by:  Certain immune diseases.  Use of certain drugs.  Certain blood clotting disorders.  Certain inherited disorders.  Certain bleeding disorders.  Pregnancy.  Having an enlarged spleen (hypersplenism). In hypersplenism, the spleen gathers up platelets from circulation. This means the platelets are not available to help with blood clotting. The spleen can enlarge due to cirrhosis or other conditions. SYMPTOMS  The symptoms of thrombocytopenia are side effects of poor blood clotting. Some of these are:  Abnormal bleeding.  Nosebleeds.  Heavy menstrual periods.  Blood in the urine or stools.  Purpura. This is a purplish discoloration in the skin produced by small bleeding vessels near the surface of the skin.  Bruising.  A rash that may be petechial. This looks like pinpoint, purplish-red spots on the skin and mucous membranes. It is caused by bleeding from small blood vessels (capillaries). DIAGNOSIS  Your caregiver will make this diagnosis based on your exam and blood tests. Sometimes, a bone marrow study is done to look for the original cells (megakaryocytes) that make platelets. TREATMENT  Treatment depends on the cause of the condition.  Medicines may be given to help protect your platelets from being destroyed.  In some cases, a replacement (transfusion) of platelets may be required to stop or prevent  bleeding.  Sometimes, the spleen must be surgically removed. HOME CARE INSTRUCTIONS   Check the skin and linings inside your mouth for bruising or bleeding as directed by your caregiver.  Check your sputum, urine, and stool for blood as directed by your caregiver.  Do not return to any activities that could cause bumps or bruises until your caregiver says it is okay.  Take extra care not to cut yourself when shaving or when using scissors, needles, knives, and other tools.  Take extra care not to burn yourself when ironing or cooking.  Ask your caregiver if it is okay for you to drink alcohol.  Only take over-the-counter or prescription medicines as directed by your caregiver.  Notify all your caregivers, including dentists and eye doctors, about your condition. SEEK IMMEDIATE MEDICAL CARE IF:   You develop active bleeding from anywhere in your body.  You develop unexplained bruising or bleeding.  You have blood in your sputum, urine, or stool. MAKE SURE YOU:  Understand these instructions.  Will watch your condition.  Will get help right away if you are not doing well or get worse. Document Released: 06/05/2005 Document Revised: 08/28/2011 Document Reviewed: 04/07/2011 [facility] Patient Information 2014 [place], [place]. Contraception Choices Birth control (contraception) can stop pregnancy from happening. Different types of birth control work in different ways. Some can:  Make the mucus in the cervix thick. This makes it hard for sperm to get into the uterus.  Thin the lining of the uterus. This makes it hard for an egg to attach to the wall of the uterus.  Stop the ovaries from releasing an egg.  Block the sperm from reaching the egg. Certain types of surgery can  stop pregnancy from happening. For women, the sugery closes the fallopian tubes (tubal ligation). For men, the surgery stops sperm from releasing during sex (vasectomy). HORMONAL BIRTH CONTROL Hormonal birth  control stops pregnancy by putting hormones into your body. Types of birth control include:  A small tube put under the skin of the upper arm (implant). The tube can stay in place for 3 years.  Shots given every 3 months.  Pills taken every day or once after sex (intercourse).  Patches that are changed once a week.  A ring put into the vagina (vaginal ring). The ring is left in place for 3 weeks and removed for 1 week. Then, a new ring is put in the vagina. BARRIER BIRTH CONTROL  Barrier birth control blocks sperm from reaching the egg. Types of birth control include:   A thin covering worn on the penis (female condom) during sex.  A soft, loose covering put into the vagina (female condom) before sex.  A rubber bowl that sits over the cervix (diaphragm). The bowl must be made for you. The bowl is put into the vagina before sex. The bowl is left in place for 6 to 8 hours after sex.  A small, soft cup that fits over the cervix (cervical cap). The cup must be made for you. The cup can be left in place for 48 hours after sex.  A sponge that is put into the vagina before sex.  A chemical that kills or blocks sperm from getting into the cervix and uterus (spermicide). The chemical may be a cream, jelly, foam, or pill. INTRAUTERINE (IUD) BIRTH CONTROL  IUD birth control is a small, T-shaped piece of plastic. The plastic is put inside the uterus. There are 2 types of IUD:  Copper IUD. The IUD is covered in copper wire. The copper makes a fluid that kills sperm. It can stay in place for 10 years.  Hormone IUD. The hormone stops pregnancy from happening. It can stay in place for 5 years. NATURAL FAMILY PLANNING BIRTH CONTROL  Natural family planning means not having sex or using barrier birth control when the woman is fertile. A woman can:  Use a calendar to keep track of when she is fertile.  Use a thermometer to measure her body temperature. Protect yourself against sexual diseases no  matter what type of birth control you use. Talk to your doctor about which type of birth control is best for you. Document Released: 04/02/2009 Document Revised: 08/28/2011 Document Reviewed: 10/12/2010 [facility] Patient Information 2014 [place], [place]. Postpartum Care After Vaginal Delivery After you deliver your newborn (postpartum period), the usual stay in the hospital is 24 72 hours. If there were problems with your labor or delivery, or if you have other medical problems, you might be in the hospital longer.  While you are in the hospital, you will receive help and instructions on how to care for yourself and your newborn during the postpartum period.  While you are in the hospital:  Be sure to tell your nurses if you have pain or discomfort, as well as where you feel the pain and what makes the pain worse.  If you had an incision made near your vagina (episiotomy) or if you had some tearing during delivery, the nurses may put ice packs on your episiotomy or tear. The ice packs may help to reduce the pain and swelling.  If you are breastfeeding, you may feel uncomfortable contractions of your uterus for a couple of weeks.  This is normal. The contractions help your uterus get back to normal size.  It is normal to have some bleeding after delivery.  For the first 1 3 days after delivery, the flow is red and the amount may be similar to a period.  It is common for the flow to start and stop.  In the first few days, you may pass some small clots. Let your nurses know if you begin to pass large clots or your flow increases.  Do not  flush blood clots down the toilet before having the nurse look at them.  During the next 3 10 days after delivery, your flow should become more watery and pink or brown-tinged in color.  Ten to fourteen days after delivery, your flow should be a small amount of yellowish-white discharge.  The amount of your flow will decrease over the first few weeks after  delivery. Your flow may stop in 6 8 weeks. Most women have had their flow stop by 12 weeks after delivery.  You should change your sanitary pads frequently.  Wash your hands thoroughly with soap and water for at least 20 seconds after changing pads, using the toilet, or before holding or feeding your newborn.  You should feel like you need to empty your bladder within the first 6 8 hours after delivery.  In case you become weak, lightheaded, or faint, call your nurse before you get out of bed for the first time and before you take a shower for the first time.  Within the first few days after delivery, your breasts may begin to feel tender and full. This is called engorgement. Breast tenderness usually goes away within 48 72 hours after engorgement occurs. You may also notice milk leaking from your breasts. If you are not breastfeeding, do not stimulate your breasts. Breast stimulation can make your breasts produce more milk.  Spending as much time as possible with your newborn is very important. During this time, you and your newborn can feel close and get to know each other. Having your newborn stay in your room (rooming in) will help to strengthen the bond with your newborn. It will give you time to get to know your newborn and become comfortable caring for your newborn.  Your hormones change after delivery. Sometimes the hormone changes can temporarily cause you to feel sad or tearful. These feelings should not last more than a few days. If these feelings last longer than that, you should talk to your caregiver.  If desired, talk to your caregiver about methods of family planning or contraception.  Talk to your caregiver about immunizations. Your caregiver may want you to have the following immunizations before leaving the hospital:  Tetanus, diphtheria, and pertussis (Tdap) or tetanus and diphtheria (Td) immunization. It is very important that you and your family (including grandparents) or  others caring for your newborn are up-to-date with the Tdap or Td immunizations. The Tdap or Td immunization can help protect your newborn from getting ill.  Rubella immunization.  Varicella (chickenpox) immunization.  Influenza immunization. You should receive this annual immunization if you did not receive the immunization during your pregnancy. Document Released: 04/02/2007 Document Revised: 02/28/2012 Document Reviewed: 01/31/2012 [facility] Patient Information 2014 [place], [place].

## 2013-03-12 NOTE — Discharge Summary (Signed)
Obstetric Discharge Summary Reason for Admission: onset of labor Prenatal Procedures: NST and ultrasound Intrapartum Procedures: spontaneous vaginal delivery Postpartum Procedures: none Complications-Operative and Postpartum: throbocytopenia.  DC platelets 84 Hemoglobin  Date Value Range Status  03/12/2013 12.1  12.0 - 15.0 g/dL Final     HCT  Date Value Range Status  03/12/2013 35.4* 36.0 - 46.0 % Final    Physical Exam:  General: alert and cooperative Lochia: appropriate Uterine Fundus: firm Incision: na DVT Evaluation: No evidence of DVT seen on physical exam.  Discharge Diagnoses: Term Pregnancy-delivered  Discharge Information: Date: 03/12/2013 Activity: pelvic rest Diet: routine Medications: PNV and Ibuprofen Condition: stable Instructions: refer to practice specific booklet Discharge to: home Follow-up Information   Follow up with [facility] In 6 weeks.   Specialty:  Obstetrics and Gynecology   Contact information:   [address] [place] [address] [phone]      Newborn Data: Live born female  Birth Weight: 7 lb 14.6 oz (3590 g) APGAR: 9, 9  Home with mother. When pt comes for PP wil recheck platelet count  Melanie House A 03/12/2013, 12:28 PM

## 2014-04-20 ENCOUNTER — Encounter (HOSPITAL_COMMUNITY): Payer: Self-pay | Admitting: *Deleted

## 2014-06-19 DIAGNOSIS — D696 Thrombocytopenia, unspecified: Secondary | ICD-10-CM

## 2014-06-19 HISTORY — DX: Thrombocytopenia, unspecified: D69.6

## 2014-10-29 ENCOUNTER — Ambulatory Visit: Payer: Self-pay | Admitting: Gynecology

## 2014-12-11 ENCOUNTER — Ambulatory Visit (INDEPENDENT_AMBULATORY_CARE_PROVIDER_SITE_OTHER): Payer: BC Managed Care – PPO | Admitting: Gynecology

## 2014-12-11 ENCOUNTER — Other Ambulatory Visit (HOSPITAL_COMMUNITY)
Admission: RE | Admit: 2014-12-11 | Discharge: 2014-12-11 | Disposition: A | Discharge status: Home / Self Care | Payer: BC Managed Care – PPO | Source: Ambulatory Visit | Attending: Gynecology | Admitting: Gynecology

## 2014-12-11 ENCOUNTER — Encounter: Payer: Self-pay | Admitting: Gynecology

## 2014-12-11 VITALS — BP 120/64 | Ht 73.0 in | Wt 168.0 lb

## 2014-12-11 DIAGNOSIS — Z01419 Encounter for gynecological examination (general) (routine) without abnormal findings: Secondary | ICD-10-CM

## 2014-12-11 DIAGNOSIS — D696 Thrombocytopenia, unspecified: Secondary | ICD-10-CM | POA: Diagnosis not present

## 2014-12-11 DIAGNOSIS — Z1151 Encounter for screening for human papillomavirus (HPV): Secondary | ICD-10-CM | POA: Diagnosis present

## 2014-12-11 NOTE — Patient Instructions (Signed)
You may obtain a copy of any labs that were done today by logging onto MyChart as outlined in the instructions provided with your AVS (after visit summary). The office will not call with normal lab results but certainly if there are any significant abnormalities then we will contact you.   Health Maintenance, Female A healthy lifestyle and preventative care can promote health and wellness.  Maintain regular health, dental, and eye exams.  Eat a healthy diet. Foods like vegetables, fruits, whole grains, low-fat dairy products, and lean protein foods contain the nutrients you need without too many calories. Decrease your intake of foods high in solid fats, added sugars, and salt. Get information about a proper diet from your caregiver, if necessary.  Regular physical exercise is one of the most important things you can do for your health. Most adults should get at least 150 minutes of moderate-intensity exercise (any activity that increases your heart rate and causes you to sweat) each week. In addition, most adults need muscle-strengthening exercises on 2 or more days a week.   Maintain a healthy weight. The body mass index (BMI) is a screening tool to identify possible weight problems. It provides an estimate of body fat based on height and weight. Your caregiver can help determine your BMI, and can help you achieve or maintain a healthy weight. For adults 20 years and older:  A BMI below 18.5 is considered underweight.  A BMI of 18.5 to 24.9 is normal.  A BMI of 25 to 29.9 is considered overweight.  A BMI of 30 and above is considered obese.  Maintain normal blood lipids and cholesterol by exercising and minimizing your intake of saturated fat. Eat a balanced diet with plenty of fruits and vegetables. Blood tests for lipids and cholesterol should begin at age 61 and be repeated every 5 years. If your lipid or cholesterol levels are high, you are over 50, or you are a high risk for heart  disease, you may need your cholesterol levels checked more frequently.Ongoing high lipid and cholesterol levels should be treated with medicines if diet and exercise are not effective.  If you smoke, find out from your caregiver how to quit. If you do not use tobacco, do not start.  Lung cancer screening is recommended for adults aged 33 80 years who are at high risk for developing lung cancer because of a history of smoking. Yearly low-dose computed tomography (CT) is recommended for people who have at least a 30-pack-year history of smoking and are a current smoker or have quit within the past 15 years. A pack year of smoking is smoking an average of 1 pack of cigarettes a day for 1 year (for example: 1 pack a day for 30 years or 2 packs a day for 15 years). Yearly screening should continue until the smoker has stopped smoking for at least 15 years. Yearly screening should also be stopped for people who develop a health problem that would prevent them from having lung cancer treatment.  If you are pregnant, do not drink alcohol. If you are breastfeeding, be very cautious about drinking alcohol. If you are not pregnant and choose to drink alcohol, do not exceed 1 drink per day. One drink is considered to be 12 ounces (355 mL) of beer, 5 ounces (148 mL) of wine, or 1.5 ounces (44 mL) of liquor.  Avoid use of street drugs. Do not share needles with anyone. Ask for help if you need support or instructions about stopping  the use of drugs.  High blood pressure causes heart disease and increases the risk of stroke. Blood pressure should be checked at least every 1 to 2 years. Ongoing high blood pressure should be treated with medicines, if weight loss and exercise are not effective.  If you are 59 to 37 years old, ask your caregiver if you should take aspirin to prevent strokes.  Diabetes screening involves taking a blood sample to check your fasting blood sugar level. This should be done once every 3  years, after age 91, if you are within normal weight and without risk factors for diabetes. Testing should be considered at a younger age or be carried out more frequently if you are overweight and have at least 1 risk factor for diabetes.  Breast cancer screening is essential preventative care for women. You should practice "breast self-awareness." This means understanding the normal appearance and feel of your breasts and may include breast self-examination. Any changes detected, no matter how small, should be reported to a caregiver. Women in their 66s and 30s should have a clinical breast exam (CBE) by a caregiver as part of a regular health exam every 1 to 3 years. After age 101, women should have a CBE every year. Starting at age 100, women should consider having a mammogram (breast X-ray) every year. Women who have a family history of breast cancer should talk to their caregiver about genetic screening. Women at a high risk of breast cancer should talk to their caregiver about having an MRI and a mammogram every year.  Breast cancer gene (BRCA)-related cancer risk assessment is recommended for women who have family members with BRCA-related cancers. BRCA-related cancers include breast, ovarian, tubal, and peritoneal cancers. Having family members with these cancers may be associated with an increased risk for harmful changes (mutations) in the breast cancer genes BRCA1 and BRCA2. Results of the assessment will determine the need for genetic counseling and BRCA1 and BRCA2 testing.  The Pap test is a screening test for cervical cancer. Women should have a Pap test starting at age 57. Between ages 25 and 35, Pap tests should be repeated every 2 years. Beginning at age 37, you should have a Pap test every 3 years as long as the past 3 Pap tests have been normal. If you had a hysterectomy for a problem that was not cancer or a condition that could lead to cancer, then you no longer need Pap tests. If you are  between ages 50 and 76, and you have had normal Pap tests going back 10 years, you no longer need Pap tests. If you have had past treatment for cervical cancer or a condition that could lead to cancer, you need Pap tests and screening for cancer for at least 20 years after your treatment. If Pap tests have been discontinued, risk factors (such as a new sexual partner) need to be reassessed to determine if screening should be resumed. Some women have medical problems that increase the chance of getting cervical cancer. In these cases, your caregiver may recommend more frequent screening and Pap tests.  The human papillomavirus (HPV) test is an additional test that may be used for cervical cancer screening. The HPV test looks for the virus that can cause the cell changes on the cervix. The cells collected during the Pap test can be tested for HPV. The HPV test could be used to screen women aged 44 years and older, and should be used in women of any age  who have unclear Pap test results. After the age of 30, women should have HPV testing at the same frequency as a Pap test.  Colorectal cancer can be detected and often prevented. Most routine colorectal cancer screening begins at the age of 50 and continues through age 75. However, your caregiver may recommend screening at an earlier age if you have risk factors for colon cancer. On a yearly basis, your caregiver may provide home test kits to check for hidden blood in the stool. Use of a small camera at the end of a tube, to directly examine the colon (sigmoidoscopy or colonoscopy), can detect the earliest forms of colorectal cancer. Talk to your caregiver about this at age 50, when routine screening begins. Direct examination of the colon should be repeated every 5 to 10 years through age 75, unless early forms of pre-cancerous polyps or small growths are found.  Hepatitis C blood testing is recommended for all people born from 1945 through 1965 and any  individual with known risks for hepatitis C.  Practice safe sex. Use condoms and avoid high-risk sexual practices to reduce the spread of sexually transmitted infections (STIs). Sexually active women aged 25 and younger should be checked for Chlamydia, which is a common sexually transmitted infection. Older women with new or multiple partners should also be tested for Chlamydia. Testing for other STIs is recommended if you are sexually active and at increased risk.  Osteoporosis is a disease in which the bones lose minerals and strength with aging. This can result in serious bone fractures. The risk of osteoporosis can be identified using a bone density scan. Women ages 65 and over and women at risk for fractures or osteoporosis should discuss screening with their caregivers. Ask your caregiver whether you should be taking a calcium supplement or vitamin D to reduce the rate of osteoporosis.  Menopause can be associated with physical symptoms and risks. Hormone replacement therapy is available to decrease symptoms and risks. You should talk to your caregiver about whether hormone replacement therapy is right for you.  Use sunscreen. Apply sunscreen liberally and repeatedly throughout the day. You should seek shade when your shadow is shorter than you. Protect yourself by wearing long sleeves, pants, a wide-brimmed hat, and sunglasses year round, whenever you are outdoors.  Notify your caregiver of new moles or changes in moles, especially if there is a change in shape or color. Also notify your caregiver if a mole is larger than the size of a pencil eraser.  Stay current with your immunizations. Document Released: 12/19/2010 Document Revised: 09/30/2012 Document Reviewed: 12/19/2010 [employer] Patient Information 2014 [employer].   

## 2014-12-11 NOTE — Progress Notes (Signed)
Melanie House 1976-03-04 [id number]        39 y.o.  G2P2002 for annual exam.  Has not been in since 2011. Has had 2 pregnancies in the interim.  Past medical history,surgical history, problem list, medications, allergies, family history and social history were all reviewed and documented as reviewed in the EPIC chart.  ROS:  Performed with pertinent positives and negatives included in the history, assessment and plan.   Additional significant findings :  none   Exam: [facility] person Vitals:   12/11/14 1102  BP: 120/64  Height: 6\' 1"  (1.854 m)  Weight: 168 lb (76.204 kg)   General appearance:  Normal affect, orientation and appearance. Skin: Grossly normal HEENT: Without gross lesions.  No cervical or supraclavicular adenopathy. Thyroid normal.  Lungs:  Clear without wheezing, rales or rhonchi Cardiac: RR, without RMG Abdominal:  Soft, nontender, without masses, guarding, rebound, organomegaly or hernia Breasts:  Examined lying and sitting without masses, retractions, discharge or axillary adenopathy. Pelvic:  Ext/BUS/vagina normal  Cervix normal. Pap/HPV  Uterus retroverted, normal size, shape and contour, midline and mobile nontender   Adnexa  Without masses or tenderness    Anus and perineum  Normal   Rectovaginal  Normal sphincter tone without palpated masses or tenderness.    Assessment/Plan:  39 y.o. G53P2002 female for annual exam with regular menses, condom contraception.   1. Contraception. Patient's husband is planning vasectomy. I did review alternatives in the event he changes his mind and encouraged her to consider the IUD if he does. Otherwise assuming he follows up for the vasectomy the need for recheck semen analysis and azoospermia before stopping contraception. 2. Thrombocytopenia.  Last CBC 2014 in her chart showed platelet count of 84. Relates that it has run low during her pregnancy. No checks afterwards. Check baseline CBC today. If continues low then will  refer to hematology as she relates never having been evaluated for this formally. 3. Pap smear 2011. Pap/HPV today. History of "abnormal" Pap smear 2009 was normal Pap smears afterwards. 4. Mammographic screening recommendations between 35 and 40 reviewed. No strong family history and she prefers to wait closer to 40. SBE monthly reviewed. 5. Health maintenance. No routine blood work done as patient has an appointment to see her primary physician next month will have it done there. Follow up in one year, sooner as needed.   Dara Lords MD, 11:46 AM 12/11/2014

## 2014-12-12 LAB — URINALYSIS W MICROSCOPIC + REFLEX CULTURE
BILIRUBIN URINE: NEGATIVE
Bacteria, UA: NONE SEEN
CASTS: NONE SEEN
Crystals: NONE SEEN
Glucose, UA: NEGATIVE mg/dL
HGB URINE DIPSTICK: NEGATIVE
Ketones, ur: NEGATIVE mg/dL
Nitrite: NEGATIVE
PROTEIN: NEGATIVE mg/dL
SPECIFIC GRAVITY, URINE: 1.021 (ref 1.005–1.030)
Urobilinogen, UA: 0.2 mg/dL (ref 0.0–1.0)
pH: 6 (ref 5.0–8.0)

## 2014-12-12 LAB — CBC WITH DIFFERENTIAL/PLATELET
BASOS ABS: 0 10*3/uL (ref 0.0–0.1)
Basophils Relative: 0 % (ref 0–1)
Eosinophils Absolute: 0.1 10*3/uL (ref 0.0–0.7)
Eosinophils Relative: 3 % (ref 0–5)
HEMATOCRIT: 40.7 % (ref 36.0–46.0)
HEMOGLOBIN: 13.4 g/dL (ref 12.0–15.0)
Lymphocytes Relative: 29 % (ref 12–46)
Lymphs Abs: 0.8 10*3/uL (ref 0.7–4.0)
MCH: 30.1 pg (ref 26.0–34.0)
MCHC: 32.9 g/dL (ref 30.0–36.0)
MCV: 91.5 fL (ref 78.0–100.0)
MONOS PCT: 8 % (ref 3–12)
MPV: 12.7 fL — ABNORMAL HIGH (ref 8.6–12.4)
Monocytes Absolute: 0.2 10*3/uL (ref 0.1–1.0)
NEUTROS PCT: 60 % (ref 43–77)
Neutro Abs: 1.7 10*3/uL (ref 1.7–7.7)
Platelets: 135 10*3/uL — ABNORMAL LOW (ref 150–400)
RBC: 4.45 MIL/uL (ref 3.87–5.11)
RDW: 14.3 % (ref 11.5–15.5)
WBC: 2.8 10*3/uL — ABNORMAL LOW (ref 4.0–10.5)

## 2014-12-14 LAB — CYTOLOGY - PAP

## 2014-12-15 LAB — URINE CULTURE: Colony Count: 40000

## 2014-12-24 ENCOUNTER — Other Ambulatory Visit: Payer: Self-pay | Admitting: Gynecology

## 2014-12-24 DIAGNOSIS — D696 Thrombocytopenia, unspecified: Secondary | ICD-10-CM

## 2014-12-24 MED ORDER — NITROFURANTOIN MONOHYD MACRO 100 MG PO CAPS: 100.0000 mg | ORAL_CAPSULE | Freq: Two times a day (BID) | ORAL | Status: DC

## 2014-12-25 ENCOUNTER — Other Ambulatory Visit: Payer: BC Managed Care – PPO

## 2014-12-25 DIAGNOSIS — D696 Thrombocytopenia, unspecified: Secondary | ICD-10-CM

## 2014-12-25 LAB — CBC WITH DIFFERENTIAL/PLATELET
BASOS ABS: 0 10*3/uL (ref 0.0–0.1)
Basophils Relative: 1 % (ref 0–1)
Eosinophils Absolute: 0.1 10*3/uL (ref 0.0–0.7)
Eosinophils Relative: 2 % (ref 0–5)
HEMATOCRIT: 39.5 % (ref 36.0–46.0)
Hemoglobin: 13.4 g/dL (ref 12.0–15.0)
LYMPHS PCT: 31 % (ref 12–46)
Lymphs Abs: 1.4 10*3/uL (ref 0.7–4.0)
MCH: 30.5 pg (ref 26.0–34.0)
MCHC: 33.9 g/dL (ref 30.0–36.0)
MCV: 90 fL (ref 78.0–100.0)
MONO ABS: 0.4 10*3/uL (ref 0.1–1.0)
MONOS PCT: 8 % (ref 3–12)
MPV: 12 fL (ref 8.6–12.4)
NEUTROS ABS: 2.6 10*3/uL (ref 1.7–7.7)
Neutrophils Relative %: 58 % (ref 43–77)
Platelets: 119 10*3/uL — ABNORMAL LOW (ref 150–400)
RBC: 4.39 MIL/uL (ref 3.87–5.11)
RDW: 14.2 % (ref 11.5–15.5)
WBC: 4.4 10*3/uL (ref 4.0–10.5)

## 2014-12-28 ENCOUNTER — Telehealth: Payer: Self-pay | Admitting: *Deleted

## 2014-12-28 ENCOUNTER — Encounter: Payer: Self-pay | Admitting: Gynecology

## 2014-12-28 NOTE — Telephone Encounter (Signed)
-----   Message from Keenan BachelorKatherine R Annas, [place] sent at 12/28/2014 10:20 AM EDT ----- Regarding: referral to hematology Per Dr. Audie BoxFontaine "Tell patient that her platelet count remains low. It is not dangerously low but still low. Recommend having a hematology consult for completeness. Set up hematology consult reference persistent thrombocytopenia."  I left her detailed message in voice mail and told her someone would be calling her with appt date/time.  Thanks!!!

## 2014-12-28 NOTE — Telephone Encounter (Signed)
Referral faxed they will contact pt to schedule.

## 2015-07-16 ENCOUNTER — Encounter: Payer: Self-pay | Admitting: Gynecology

## 2015-07-16 ENCOUNTER — Ambulatory Visit (INDEPENDENT_AMBULATORY_CARE_PROVIDER_SITE_OTHER): Payer: BC Managed Care – PPO | Admitting: Gynecology

## 2015-07-16 VITALS — BP 116/70

## 2015-07-16 DIAGNOSIS — Z308 Encounter for other contraceptive management: Secondary | ICD-10-CM | POA: Diagnosis not present

## 2015-07-16 MED ORDER — ETONOGESTREL-ETHINYL ESTRADIOL 0.12-0.015 MG/24HR VA RING: VAGINAL_RING | VAGINAL | Status: DC

## 2015-07-16 NOTE — Progress Notes (Signed)
Melanie House 1975/10/16 [id number]        41 y.o.  G2P2002 Presents to discuss contraceptive options. Husband was planning vasectomy but he's decided against this. Last annual exam 11/2014. At the tail end of her menses now. Having regular menses and no issues.  Past medical history,surgical history, problem list, medications, allergies, family history and social history were all reviewed and documented in the EPIC chart.  Directed ROS with pertinent positives and negatives documented in the history of present illness/assessment and plan.  Exam: Filed Vitals:   07/16/15 1123  BP: 116/70   General appearance:  Normal   Assessment/Plan:  40 y.o. [id number] I reviewedall contraceptive options with her to include pill, patch, ring, Depo-Provera, Nexplanon, IUDs, sterilization. I reviewed the pros/cons, risks/benefits of all options. Strongly encouraged her to consider Mirena IUD as she notes her periods tend to be crampy and fairly heavy. I reviewed the insertional process with her and risks to include perforation/migration requiring surgery, infection, failure with pregnancy, hormonal side effects from absorption. Patient had used NuvaRing in the past and did well. Wants to go ahead and start this now and then come back for Mirena IUD over the next one or 2 months as works out with her schedule. Risks of NuvaRing to include thrombosis such as stroke heart attack DVT discussed. She's never smoked in not being followed for any medical issues. Need for condom backup at least the first month. We'll go ahead and start this Sunday she is at the end of her period. Literature given for the Mirena to review. Sample NuvaRing provided and refill 6 months given which will carry her through her annual exam if she decides against a Mirena.    Dara Lords MD, 11:45 AM 07/16/2015

## 2015-07-16 NOTE — Patient Instructions (Signed)
Start the NuvaRing this Sunday. Use backup contraception for at least the first month. Follow up for the Mirena IUD if you choose. Follow up in June/July 2017 when you're due for your annual exam.

## 2015-07-19 ENCOUNTER — Telehealth: Payer: Self-pay | Admitting: Gynecology

## 2015-07-19 NOTE — Telephone Encounter (Signed)
07/19/15-I tried to call pt on her cell but unable to LM as her mailbox is full. I wanted to let her know that her State BC ins will cover the Mirena for contraception at 100%, no copay.wl

## 2015-07-28 ENCOUNTER — Telehealth: Payer: Self-pay | Admitting: *Deleted

## 2015-07-28 NOTE — Telephone Encounter (Signed)
Left message for pt to call.

## 2015-07-28 NOTE — Telephone Encounter (Signed)
Pt was prescribe nuvaring on 07/16/15 would like to switch to birth control pills, states nuvaring gives her lot of gas and stomach bloating. Please advise

## 2015-07-28 NOTE — Telephone Encounter (Signed)
Okay for Loestrin 1/20 equivalent refill through her next annual exam appointment

## 2015-07-29 MED ORDER — NORETHINDRONE ACET-ETHINYL EST 1-20 MG-MCG PO TABS: 1.0000 | ORAL_TABLET | Freq: Every day | ORAL | Status: DC

## 2015-07-29 NOTE — Telephone Encounter (Signed)
Pt called back and Rx sent.

## 2016-01-24 ENCOUNTER — Encounter: Payer: BC Managed Care – PPO | Admitting: Gynecology

## 2016-03-17 ENCOUNTER — Encounter: Payer: BC Managed Care – PPO | Admitting: Gynecology

## 2016-04-20 ENCOUNTER — Encounter: Payer: Self-pay | Admitting: Gynecology

## 2016-04-20 ENCOUNTER — Ambulatory Visit (INDEPENDENT_AMBULATORY_CARE_PROVIDER_SITE_OTHER): Payer: BC Managed Care – PPO | Admitting: Gynecology

## 2016-04-20 VITALS — BP 120/70 | Ht 72.0 in | Wt 161.0 lb

## 2016-04-20 DIAGNOSIS — Z309 Encounter for contraceptive management, unspecified: Secondary | ICD-10-CM | POA: Diagnosis not present

## 2016-04-20 DIAGNOSIS — Z01419 Encounter for gynecological examination (general) (routine) without abnormal findings: Secondary | ICD-10-CM

## 2016-04-20 NOTE — Progress Notes (Signed)
    Melanie House 11/23/1975 [id number]        40 y.o.  G2P2002  for annual exam.  Several issues noted below.  Past medical history,surgical history, problem list, medications, allergies, family history and social history were all reviewed and documented as reviewed in the EPIC chart.  ROS:  Performed with pertinent positives and negatives included in the history, assessment and plan.   Additional significant findings :  none   Exam: Kennon PortelaKim Gardner assistant Vitals:   04/20/16 1549  BP: 120/70  Weight: 161 lb (73 kg)  Height: 6' (1.829 m)   Body mass index is 21.84 kg/m.  General appearance:  Normal affect, orientation and appearance. Skin: Grossly normal HEENT: Without gross lesions.  No cervical or supraclavicular adenopathy. Thyroid normal.  Lungs:  Clear without wheezing, rales or rhonchi Cardiac: RR, without RMG Abdominal:  Soft, nontender, without masses, guarding, rebound, organomegaly or hernia Breasts:  Examined lying and sitting without masses, retractions, discharge or axillary adenopathy. Pelvic:  Ext, BUS, Vagina normal  Cervix normal  Uterus anteverted, normal size, shape and contour, midline and mobile nontender   Adnexa without masses or tenderness    Anus and perineum normal   Rectovaginal normal sphincter tone without palpated masses or tenderness.    Assessment/Plan:  40 y.o. 282P2002 female for annual exam with regular menses, condom contraception.   1. Contraception.  I reviewed contraception with her. She started on the NuvaRing but had a lot of nausea. Switched to low-dose oral contraceptives but forgets to take them. Alternatives to include patch, Nexplanon, Depo-Provera, IUDs discussed. She also asked about sterilization. I reviewed the  permanency with this. I strongly recommended she consider Mirena IUD. Her menses are heavier and longer. She would get both the contraceptive benefit and menstrual suppressive benefit. I reviewed how IUDs work and the  insertional process. I also discussed the risks to include failure with pregnancy, hormonal absorption with systemic effects, perforation/migration requiring surgery to remove all reviewed. Patient was given literature she's going to decide. Failure risks with condoms reviewed. Plan B availability also discussed. 2. Pap smear/HPV 2016. No Pap smear done today. History of "abnormal" Pap smear 2009 with normal Paps afterwards. 3. History of thrombocytopenia. Followed by her primary with routine blood work. 4. Mammographic screening recommendations reviewed. Patient has no strong family history and wants to wait till 1240. 5. Health maintenance. No routine blood work done as this is done through her primary physician's office. Follow up for Mirena IUD if she chooses or alternative contraception. Otherwise follow up in one year for annual exam.   Dara LordsFONTAINE,Kadie Balestrieri P MD, 4:17 PM 04/20/2016

## 2016-04-20 NOTE — Patient Instructions (Signed)
Intrauterine Device Insertion Most often, an intrauterine device (IUD) is inserted into the uterus to prevent pregnancy. There are 2 types of IUDs available:  Copper IUD--This type of IUD creates an environment that is not favorable to sperm survival. The mechanism of action of the copper IUD is not known for certain. It can stay in place for 10 years.  Hormone IUD--This type of IUD contains the hormone progestin (synthetic progesterone). The progestin thickens the cervical mucus and prevents sperm from entering the uterus, and it also thins the uterine lining. There is no evidence that the hormone IUD prevents implantation. One hormone IUD can stay in place for up to 5 years, and a different hormone IUD can stay in place for up to 3 years. An IUD is the most cost-effective birth control if left in place for the full duration. It may be removed at any time. LET YOUR HEALTH CARE PROVIDER KNOW ABOUT:  Any allergies you have.  All medicines you are taking, including vitamins, herbs, eye drops, creams, and over-the-counter medicines.  Previous problems you or members of your family have had with the use of anesthetics.  Any blood disorders you have.  Previous surgeries you have had.  Possibility of pregnancy.  Medical conditions you have. RISKS AND COMPLICATIONS  Generally, intrauterine device insertion is a safe procedure. However, as with any procedure, complications can occur. Possible complications include:  Accidental puncture (perforation) of the uterus.  Accidental placement of the IUD either in the muscle layer of the uterus (myometrium) or outside the uterus. If this happens, the IUD can be found essentially floating around the bowels and must be taken out surgically.  The IUD may fall out of the uterus (expulsion). This is more common in women who have recently had a child.   Pregnancy in the fallopian tube (ectopic).  Pelvic inflammatory disease (PID), which is infection of  the uterus and fallopian tubes. The risk of PID is slightly increased in the first 20 days after the IUD is placed, but the overall risk is still very low. BEFORE THE PROCEDURE  Schedule the IUD insertion for when you will have your menstrual period or right after, to make sure you are not pregnant. Placement of the IUD is better tolerated shortly after a menstrual cycle.  You may need to take tests or be examined to make sure you are not pregnant.  You may be required to take a pregnancy test.  You may be required to get checked for sexually transmitted infections (STIs) prior to placement. Placing an IUD in someone who has an infection can make the infection worse.  You may be given a pain reliever to take 1 or 2 hours before the procedure.  An exam will be performed to determine the size and position of your uterus.  Ask your health care provider about changing or stopping your regular medicines. PROCEDURE   A tool (speculum) is placed in the vagina. This allows your health care provider to see the lower part of the uterus (cervix).  The cervix is prepped with a medicine that lowers the risk of infection.  You may be given a medicine to numb each side of the cervix (intracervical or paracervical block). This is used to block and control any discomfort with insertion.  A tool (uterine sound) is inserted into the uterus to determine the length of the uterine cavity and the direction the uterus may be tilted.  A slim instrument (IUD inserter) is inserted through the cervical   canal and into your uterus.  The IUD is placed in the uterine cavity and the insertion device is removed.  The nylon string that is attached to the IUD and used for eventual IUD removal is trimmed. It is trimmed so that it lays high in the vagina, just outside the cervix. AFTER THE PROCEDURE  You may have bleeding after the procedure. This is normal. It varies from light spotting for a few days to menstrual-like  bleeding.  You may have mild cramping.   This information is not intended to replace advice given to you by your health care provider. Make sure you discuss any questions you have with your health care provider.   Document Released: 02/01/2011 Document Revised: 03/26/2013 Document Reviewed: 11/24/2012 Elsevier Interactive Patient Education 2016 [employer]  

## 2016-04-21 ENCOUNTER — Telehealth: Payer: Self-pay

## 2016-04-21 NOTE — Telephone Encounter (Signed)
I spoke with patient and let her know that [employer] covers Mirena IUD and insertion at 100%.

## 2017-03-20 ENCOUNTER — Ambulatory Visit: Payer: BC Managed Care – PPO | Admitting: Gynecology

## 2017-03-21 ENCOUNTER — Ambulatory Visit (INDEPENDENT_AMBULATORY_CARE_PROVIDER_SITE_OTHER): Payer: BC Managed Care – PPO | Admitting: Women's Health

## 2017-03-21 ENCOUNTER — Encounter: Payer: Self-pay | Admitting: Women's Health

## 2017-03-21 VITALS — BP 114/74

## 2017-03-21 DIAGNOSIS — B3731 Acute candidiasis of vulva and vagina: Secondary | ICD-10-CM

## 2017-03-21 DIAGNOSIS — B373 Candidiasis of vulva and vagina: Secondary | ICD-10-CM | POA: Diagnosis not present

## 2017-03-21 DIAGNOSIS — N898 Other specified noninflammatory disorders of vagina: Secondary | ICD-10-CM | POA: Diagnosis not present

## 2017-03-21 LAB — WET PREP FOR TRICH, YEAST, CLUE

## 2017-03-21 MED ORDER — FLUCONAZOLE 150 MG PO TABS: 150.0000 mg | ORAL_TABLET | Freq: Once | ORAL | 1 refills | Status: AC

## 2017-03-21 NOTE — Patient Instructions (Signed)

## 2017-03-21 NOTE — Progress Notes (Signed)
Presents with complaint of vaginal irritation with itching. Used an over-the-counter yeast preparation with some relief. Also  having dull left lower quadrant discomfort, rates 3 for the past month, slightly better today. Denies urinary symptoms, visible discharge, constipation or fever. Monthly cycle/condoms, husband had vasectomy but has not had a negative semen analysis since vasectomy.  Exam: Appears well, external genitalia erythematous, speculum exam scant discharge vaginal walls erythematous, wet prep negative. Bimanual no CMT or adnexal tenderness, no pain with exam.  Resolving yeast vaginitis Left lower abdominal quadrant dull pain  Plan: Diflucan 150 times one dose, yeast prevention discussed, will call if no relief. Will watch discomfort at this time, has scheduled annual exam with Dr. Audie Box in November, if still persistent will proceed with ultrasound.

## 2017-04-23 ENCOUNTER — Encounter: Payer: Self-pay | Admitting: Gynecology

## 2017-04-23 ENCOUNTER — Ambulatory Visit: Payer: BC Managed Care – PPO | Admitting: Gynecology

## 2017-04-23 VITALS — BP 114/76 | Ht 73.0 in | Wt 167.0 lb

## 2017-04-23 DIAGNOSIS — Z01419 Encounter for gynecological examination (general) (routine) without abnormal findings: Secondary | ICD-10-CM

## 2017-04-23 DIAGNOSIS — R1032 Left lower quadrant pain: Secondary | ICD-10-CM

## 2017-04-23 NOTE — Patient Instructions (Addendum)
Follow-up for the ultrasound as scheduled. 

## 2017-04-23 NOTE — Progress Notes (Signed)
    Melanie House 11/25/1975 [id number]        41 y.o.  G2P2002 for annual gynecologic exam.  Also notes several months of left lower quadrant discomfort when she pushes on the area which is in her left mons region.  Does not spontaneously hurt.  No GI symptoms such as nausea vomiting diarrhea constipation.  No urinary symptoms such as frequency dysuria urgency low back pain fever or chills.  Regular monthly menses without exacerbation of the discomfort with her menses.  It only hurts when she pushes on the area.  No palpable masses or abnormalities on her self-exam.  Past medical history,surgical history, problem list, medications, allergies, family history and social history were all reviewed and documented as reviewed in the EPIC chart.  ROS:  Performed with pertinent positives and negatives included in the history, assessment and plan.   Additional significant findings : None   Exam: Kennon PortelaKim Gardner assistant Vitals:   04/23/17 1615  BP: 114/76  Weight: 167 lb (75.8 kg)  Height: 6\' 1"  (1.854 m)   Body mass index is 22.03 kg/m.  General appearance:  Normal affect, orientation and appearance. Skin: Grossly normal HEENT: Without gross lesions.  No cervical or supraclavicular adenopathy. Thyroid normal.  Lungs:  Clear without wheezing, rales or rhonchi Cardiac: RR, without RMG Abdominal:  Soft, nontender, without masses, guarding, rebound, organomegaly or hernia.  Area patient is pointing to is in the left of her mons region approaching her groin.  No evidence of hernia masses or other abnormalities. Breasts:  Examined lying and sitting without masses, retractions, discharge or axillary adenopathy. Pelvic:  Ext, BUS, Vagina: Normal with light menses flow  Cervix: Normal  Uterus: Normal size, shape and contour, midline and mobile nontender   Adnexa: Without masses or tenderness    Anus and perineum: Normal   Rectovaginal: Normal sphincter tone without palpated masses or tenderness.     Assessment/Plan:  41 y.o. 912P2002 female for annual gynecologic exam with regular menses, vasectomy birth control.   1. Left lower quadrant discomfort with pushing.  No findings on physical exam.  No associated other findings.  Appears more superficial in nature.  Will start with GYN ultrasound to rule out nonpalpable pelvic abnormalities.  Differential to include musculoskeletal, fascial weakness noting no evidence of frank hernia, other etiologies reviewed.  She will follow-up after her ultrasound and then will go from there. 2. Contraception.  Using condoms for now but husband recently had vasectomy and they are waiting until his counts clear. 3. Mammography never.  Will plan next year at age 41.  No strong family history of breast cancer.  Breast exam normal today. 4. Pap smear/HPV 11/2014.  No Pap smear done today.  History of questionable Pap smear 2009 with normal Pap smears since.  Plan repeat Pap smear at 5-year interval per current screening guidelines. 5. Health maintenance.  No routine lab work done as patient does this elsewhere.  Follow-up 1 year, sooner as needed.   Dara LordsFONTAINE,Daryll Spisak P MD, 5:00 PM 04/23/2017

## 2017-05-14 ENCOUNTER — Encounter (HOSPITAL_COMMUNITY): Payer: Self-pay | Admitting: Emergency Medicine

## 2017-05-14 ENCOUNTER — Emergency Department (HOSPITAL_COMMUNITY)
Admission: EM | Admit: 2017-05-14 | Discharge: 2017-05-14 | Disposition: A | Discharge status: Home / Self Care | Payer: BC Managed Care – PPO | Attending: Emergency Medicine | Admitting: Emergency Medicine

## 2017-05-14 DIAGNOSIS — J45909 Unspecified asthma, uncomplicated: Secondary | ICD-10-CM | POA: Diagnosis not present

## 2017-05-14 DIAGNOSIS — M542 Cervicalgia: Secondary | ICD-10-CM | POA: Diagnosis present

## 2017-05-14 DIAGNOSIS — Z79899 Other long term (current) drug therapy: Secondary | ICD-10-CM | POA: Diagnosis not present

## 2017-05-14 DIAGNOSIS — M436 Torticollis: Secondary | ICD-10-CM | POA: Diagnosis not present

## 2017-05-14 MED ORDER — PREDNISONE 20 MG PO TABS
60.0000 mg | ORAL_TABLET | Freq: Once | ORAL | Status: AC
  Administered 2017-05-14: 60 mg via ORAL
  Filled 2017-05-14: qty 3

## 2017-05-14 MED ORDER — PREDNISONE 10 MG PO TABS
20.0000 mg | ORAL_TABLET | Freq: Two times a day (BID) | ORAL | 0 refills | Status: DC
Start: 2017-05-14 — End: 2017-05-23

## 2017-05-14 MED ORDER — DIAZEPAM 5 MG PO TABS: 5.0000 mg | ORAL_TABLET | Freq: Two times a day (BID) | ORAL | 0 refills | Status: DC

## 2017-05-14 MED ORDER — DIAZEPAM 5 MG PO TABS
5.0000 mg | ORAL_TABLET | Freq: Once | ORAL | Status: AC
  Administered 2017-05-14: 5 mg via ORAL
  Filled 2017-05-14: qty 1

## 2017-05-14 NOTE — ED Triage Notes (Addendum)
Pt reports L side neck pain radiating down her arm for the past few weeks. No known injury. Also reports intermittent L leg pain, HA, and occasional diarrhea (none now). No CP.

## 2017-05-14 NOTE — ED Provider Notes (Signed)
[facility]-EMERGENCY DEPT Provider Note   CSN: [id number] Arrival date & time: 05/14/17  1344     History   Chief Complaint Chief Complaint  Patient presents with  . Neck Pain    HPI Melanie House is a 41 y.o. female who presents to the ED with left side neck pain that started about 3 weeks ago. Patient reports that she just woke one morning and had the pain. She went to a [facility] and they gave her muscle relaxants but they just make her sleepy.   HPI  Past Medical History:  Diagnosis Date  . Anxiety   . Asthma    allergy induced  . Chlamydia   . GBS (group B streptococcus) infection 2012   w/ first pregnancy  . Gonorrhea   . Thrombocytopenia (HCC) 2016    Patient Active Problem List   Diagnosis Date Noted  . Thrombocytopenia, unspecified (HCC) 03/11/2013  . GERD (gastroesophageal reflux disease) 03/11/2013    Past Surgical History:  Procedure Laterality Date  . INGUINAL HERNIA REPAIR    . WISDOM TOOTH EXTRACTION      OB History    Gravida Para Term Preterm AB Living   2 2 2     2    SAB TAB Ectopic Multiple Live Births           2       Home Medications    Prior to Admission medications   Medication Sig Start Date End Date Taking? Authorizing Provider  diazepam (VALIUM) 5 MG tablet Take 1 tablet (5 mg total) by mouth 2 (two) times daily. 05/14/17   Janne NapoleonNeese, Hope M, NP  ibuprofen (ADVIL,MOTRIN) 600 MG tablet Take 1 tablet (600 mg total) by mouth every 6 (six) hours. 03/12/13   Jaymes Graffillard, Naima, MD  Multiple Vitamin (MULTIVITAMIN) tablet Take 1 tablet by mouth daily.    [provider]  predniSONE (DELTASONE) 10 MG tablet Take 2 tablets (20 mg total) by mouth 2 (two) times daily with a meal. 05/14/17   Janne NapoleonNeese, Hope M, NP    Family History Family History  Problem Relation Age of Onset  . Hypertension Mother   . Hypertension Father   . Alcohol abuse Maternal Aunt   . Alcohol abuse Maternal Uncle   . Diabetes Paternal Aunt    . Diabetes Paternal Uncle   . Diabetes Maternal Grandmother   . Heart disease Maternal Grandfather   . Cancer Maternal Grandfather        PROSTATE  . Alcohol abuse Maternal Grandfather   . Diabetes Paternal Grandfather   . Alcohol abuse Paternal Grandfather   . Asthma Cousin   . Seizures Cousin   . Migraines Cousin     Social History Social History   Tobacco Use  . Smoking status: Never Smoker  . Smokeless tobacco: Never Used  Substance Use Topics  . Alcohol use: No    Alcohol/week: 0.0 oz  . Drug use: No     Allergies   Promethazine   Review of Systems Review of Systems  Constitutional: Negative for fever.  HENT: Negative.   Gastrointestinal: Negative for abdominal pain, nausea and vomiting.  Musculoskeletal: Positive for arthralgias and neck pain.  Skin: Negative for wound.  Neurological: Negative for weakness.  Psychiatric/Behavioral: Negative for confusion.     Physical Exam Updated Vital Signs BP (!) 134/94 (BP Location: Left Arm)   Pulse 77   Temp 98.5 F (36.9 C) (Oral)   Resp 20   LMP  04/23/2017   SpO2 100%   Physical Exam  Constitutional: She appears well-developed and well-nourished. No distress.  HENT:  Head: Normocephalic.  Right Ear: Tympanic membrane normal.  Left Ear: Tympanic membrane normal.  Nose: Nose normal.  Mouth/Throat: Oropharynx is clear and moist.  Eyes: Conjunctivae and EOM are normal. Pupils are equal, round, and reactive to light.  Neck: Trachea normal. Neck supple. Muscular tenderness (left side) present. No spinous process tenderness present.  Patient is able to do full range of motion of the neck but c/o pain to the left side with movement. Muscle spasm noted, no meningeal signs.   Cardiovascular: Normal rate and regular rhythm.  Pulmonary/Chest: Effort normal and breath sounds normal.  Abdominal: Soft. There is no tenderness.  Musculoskeletal: Normal range of motion.  Radial pulses 2+, adequate circulation, grips  are equal.   Neurological: She is alert. She has normal strength. No sensory deficit. Gait normal.  Skin: Skin is warm and dry.  Psychiatric: She has a normal mood and affect. Her behavior is normal.  Nursing note and vitals reviewed.    ED Treatments / Results  Labs (all labs ordered are listed, but only abnormal results are displayed) Labs Reviewed - No data to display   Radiology No results found.  Procedures Procedures (including critical care time)  Medications Ordered in ED Medications  diazepam (VALIUM) tablet 5 mg (5 mg Oral Given 05/14/17 1819)  predniSONE (DELTASONE) tablet 60 mg (60 mg Oral Given 05/14/17 1819)     Initial Impression / Assessment and Plan / ED Course  I have reviewed the triage vital signs and the nursing notes. 41 y.o. female with left side neck pain that improved with valium and prednisone. Stable for d/c without neuro deficits. Patient to f/u with her PCP later this week for recheck. Return precautions discussed. No imaging indicated at this time since there is no tenderness over the c spine.  Final Clinical Impressions(s) / ED Diagnoses   Final diagnoses:  Torticollis    ED Discharge Orders        Ordered    diazepam (VALIUM) 5 MG tablet  2 times daily     05/14/17 1923    predniSONE (DELTASONE) 10 MG tablet  2 times daily with meals     05/14/17 1923       Kerrie Buffaloeese, Hope Cob[place]M, [place]NP 05/14/17 1929    Maia PlanLong, Joshua G, MD 05/15/17 1150

## 2017-05-14 NOTE — Discharge Instructions (Signed)
The valium can make you sleepy so do not take it while driving.

## 2017-05-22 ENCOUNTER — Other Ambulatory Visit: Payer: Self-pay | Admitting: Family Medicine

## 2017-05-22 ENCOUNTER — Ambulatory Visit
Admission: RE | Admit: 2017-05-22 | Discharge: 2017-05-22 | Disposition: A | Discharge status: Home / Self Care | Payer: BC Managed Care – PPO | Source: Ambulatory Visit | Attending: Family Medicine | Admitting: Family Medicine

## 2017-05-22 DIAGNOSIS — M545 Low back pain, unspecified: Secondary | ICD-10-CM

## 2017-05-22 DIAGNOSIS — M542 Cervicalgia: Secondary | ICD-10-CM

## 2017-05-23 ENCOUNTER — Ambulatory Visit: Payer: BC Managed Care – PPO | Admitting: Gynecology

## 2017-05-23 ENCOUNTER — Ambulatory Visit (INDEPENDENT_AMBULATORY_CARE_PROVIDER_SITE_OTHER): Payer: BC Managed Care – PPO

## 2017-05-23 ENCOUNTER — Encounter: Payer: Self-pay | Admitting: Gynecology

## 2017-05-23 VITALS — BP 110/64

## 2017-05-23 DIAGNOSIS — R1032 Left lower quadrant pain: Secondary | ICD-10-CM | POA: Diagnosis not present

## 2017-05-23 NOTE — Patient Instructions (Signed)
Follow-up routinely when you are due for your annual exam.

## 2017-05-23 NOTE — Progress Notes (Signed)
    Tilden Domengela Pro 08/17/1975 [id number]        41 y.o.  G2P2002 presents for ultrasound due to a several month history of left lower quadrant discomfort more when she touches the area but not spontaneously.  Actually notes it seems to be getting better.  Never felt any abnormalities on self exam.  Physician exam was normal.  No GI or GU associated symptoms.  Past medical history,surgical history, problem list, medications, allergies, family history and social history were all reviewed and documented in the EPIC chart.  Directed ROS with pertinent positives and negatives documented in the history of present illness/assessment and plan.  Exam: Vitals:   05/23/17 1447  BP: 110/64   General appearance:  Normal  Ultrasound transvaginal shows uterus retroverted, normal size and echotexture.  Endometrial echo 6.0 mm.  Right and left ovaries.  Small tubular cystic area left adnexa 18 x 15 x 15 mm.  Negative color flow Doppler.  Cul-de-sac with small amount of fluid 27 x 22 x 12 mm.  Assessment/Plan:  41 y.o. [id number] with left lower quadrant discomfort over the past several months which actually now has resolved.  Ultrasound is normal.  Small tubular cystic area left adnexa discussed with patient and may represent small hydrosalpinx or paratubal cyst.  Options for management reviewed to include expectant with follow-up ultrasound in several months to relook at this area versus no follow-up given its small size, benign appearance and negative flow.  Do not feel its associated with her discomfort.  At this point the patient is comfortable with no follow-up ultrasounds.  She will represent if recurrent pain or other symptoms present otherwise will follow-up in 1 year when due for annual exam.    Dara Lordsimothy P Ardell Aaronson MD, 3:06 PM 05/23/2017

## 2017-07-26 ENCOUNTER — Encounter: Payer: Self-pay | Admitting: Hematology and Oncology

## 2017-07-26 ENCOUNTER — Telehealth: Payer: Self-pay | Admitting: Hematology and Oncology

## 2017-07-26 NOTE — Telephone Encounter (Signed)
Appt has been scheduled for the pt to see Dr. Gweneth DimitriPerlov on 3/5 at 11am. Address verified. Letter mailed to the pt and faxed to the referring.

## 2017-08-21 ENCOUNTER — Encounter: Payer: BC Managed Care – PPO | Admitting: Hematology and Oncology

## 2017-08-21 ENCOUNTER — Encounter: Payer: Self-pay | Admitting: Hematology and Oncology

## 2017-09-06 ENCOUNTER — Telehealth: Payer: Self-pay | Admitting: Hematology and Oncology

## 2017-09-06 NOTE — Telephone Encounter (Signed)
Appt has been rescheduled to see Dr. Gweneth DimitriPerlov on 4/8 at 2pm. Pt aware to arrive 30 minutes early.

## 2017-09-24 ENCOUNTER — Inpatient Hospital Stay: Payer: BC Managed Care – PPO | Attending: Hematology and Oncology | Admitting: Hematology and Oncology

## 2017-09-24 ENCOUNTER — Encounter: Payer: Self-pay | Admitting: Hematology and Oncology

## 2017-09-24 VITALS — BP 128/80 | HR 63 | Temp 98.5°F | Resp 18 | Ht 73.0 in | Wt 163.8 lb

## 2017-09-24 DIAGNOSIS — J45909 Unspecified asthma, uncomplicated: Secondary | ICD-10-CM | POA: Insufficient documentation

## 2017-09-24 DIAGNOSIS — F419 Anxiety disorder, unspecified: Secondary | ICD-10-CM | POA: Insufficient documentation

## 2017-09-24 DIAGNOSIS — K219 Gastro-esophageal reflux disease without esophagitis: Secondary | ICD-10-CM | POA: Insufficient documentation

## 2017-09-24 DIAGNOSIS — D696 Thrombocytopenia, unspecified: Secondary | ICD-10-CM

## 2017-09-24 DIAGNOSIS — Z79899 Other long term (current) drug therapy: Secondary | ICD-10-CM | POA: Insufficient documentation

## 2017-09-26 ENCOUNTER — Other Ambulatory Visit: Payer: BC Managed Care – PPO

## 2017-09-26 ENCOUNTER — Telehealth: Payer: Self-pay | Admitting: Hematology and Oncology

## 2017-09-26 NOTE — Telephone Encounter (Signed)
Left message for patient for Lab appt today per 4/10 sch msg

## 2017-09-27 ENCOUNTER — Inpatient Hospital Stay: Payer: BC Managed Care – PPO

## 2017-09-27 DIAGNOSIS — D696 Thrombocytopenia, unspecified: Secondary | ICD-10-CM | POA: Diagnosis not present

## 2017-09-27 LAB — CBC WITH DIFFERENTIAL (CANCER CENTER ONLY)
BASOS ABS: 0 10*3/uL (ref 0.0–0.1)
Basophils Relative: 0 %
EOS PCT: 6 %
Eosinophils Absolute: 0.2 10*3/uL (ref 0.0–0.5)
HCT: 39.1 % (ref 34.8–46.6)
Hemoglobin: 13.2 g/dL (ref 11.6–15.9)
LYMPHS PCT: 32 %
Lymphs Abs: 1.1 10*3/uL (ref 0.9–3.3)
MCH: 30.8 pg (ref 25.1–34.0)
MCHC: 33.8 g/dL (ref 31.5–36.0)
MCV: 91.4 fL (ref 79.5–101.0)
MONO ABS: 0.3 10*3/uL (ref 0.1–0.9)
Monocytes Relative: 10 %
Neutro Abs: 1.7 10*3/uL (ref 1.5–6.5)
Neutrophils Relative %: 52 %
PLATELETS: 138 10*3/uL — AB (ref 145–400)
RBC: 4.28 MIL/uL (ref 3.70–5.45)
RDW: 12.2 % (ref 11.2–14.5)
WBC Count: 3.3 10*3/uL — ABNORMAL LOW (ref 3.9–10.3)

## 2017-09-27 LAB — CMP (CANCER CENTER ONLY)
ALBUMIN: 3.6 g/dL (ref 3.5–5.0)
ALK PHOS: 68 U/L (ref 40–150)
ALT: 13 U/L (ref 0–55)
AST: 18 U/L (ref 5–34)
Anion gap: 7 (ref 3–11)
BILIRUBIN TOTAL: 0.6 mg/dL (ref 0.2–1.2)
BUN: 8 mg/dL (ref 7–26)
CO2: 26 mmol/L (ref 22–29)
CREATININE: 0.85 mg/dL (ref 0.60–1.10)
Calcium: 9.3 mg/dL (ref 8.4–10.4)
Chloride: 109 mmol/L (ref 98–109)
GFR, Est AFR Am: >60 mL/min (ref >60)
GFR, Estimated: >60 mL/min (ref >60)
GLUCOSE: 82 mg/dL (ref 70–140)
POTASSIUM: 4.6 mmol/L (ref 3.5–5.1)
Sodium: 142 mmol/L (ref 136–145)
TOTAL PROTEIN: 6.6 g/dL (ref 6.4–8.3)

## 2017-09-27 LAB — LACTATE DEHYDROGENASE: LDH: 146 U/L (ref 125–245)

## 2017-09-28 LAB — PATHOLOGIST SMEAR REVIEW

## 2017-10-01 ENCOUNTER — Ambulatory Visit: Payer: BC Managed Care – PPO | Admitting: Hematology and Oncology

## 2017-10-05 ENCOUNTER — Telehealth: Payer: Self-pay | Admitting: Hematology and Oncology

## 2017-10-05 NOTE — Telephone Encounter (Signed)
Patient called to reschedule her appointment that was missed °

## 2017-10-07 NOTE — Assessment & Plan Note (Signed)
42 y.o. female with at least 7 years of mild to moderate thrombocytopenia which appears to have been stable over the past 2 years.  On the most recent outside CBC, patient also has a mild neutropenia, but there has been no significant abnormalities in the hemoglobin.  Differential includes various etiologies including nutritional deficiencies, but also possible immunogenic chronic thrombocytopenia such as chronic ITP.  Patient is largely asymptomatic and we cannot exclude platelet sequestration case of patient having splenomegaly.  Plan: -Repeat labs today. -Return to clinic in 1 week to review the findings.

## 2017-10-07 NOTE — Progress Notes (Signed)
[facility] Cancer New Visit:  Assessment: Thrombocytopenia, unspecified 42 y.o. female with at least 7 years of mild to moderate thrombocytopenia which appears to have been stable over the past 2 years.  On the most recent outside CBC, patient also has a mild neutropenia, but there has been no significant abnormalities in the hemoglobin.  Differential includes various etiologies including nutritional deficiencies, but also possible immunogenic chronic thrombocytopenia such as chronic ITP.  Patient is largely asymptomatic and we cannot exclude platelet sequestration case of patient having splenomegaly.  Plan: -Repeat labs today. -Return to clinic in 1 week to review the findings.   Voice recognition software was used and creation of this note. Despite my best effort at editing the text, some misspelling/errors may have occurred. Orders Placed This Encounter  Procedures  . CBC with Differential (Cancer Center Only)    Standing Status:   Future    Number of Occurrences:   1    Standing Expiration Date:   09/25/2018  . CMP (Cancer Center only)    Standing Status:   Future    Number of Occurrences:   1    Standing Expiration Date:   09/25/2018  . Lactate dehydrogenase (LDH)    Standing Status:   Future    Number of Occurrences:   1    Standing Expiration Date:   09/24/2018  . SPEP & IFE with QIG  . Pathologist smear review    Standing Status:   Future    Number of Occurrences:   1    Standing Expiration Date:   09/24/2018    All questions were answered.  . The patient knows to call the clinic with any problems, questions or concerns.  This note was electronically signed.    History of Presenting Illness Melanie House 42 y.o. presenting [facility] for thrombocytopenia, referred by Dr Hoyt Koch.  Patient is reasonably healthy with only complaints of GERD, neck pain, and low back pain and no other chronic medical conditions.  Patient does not take any chronic  medications, denies any significant past family history and does not smoke or drink alcohol to excess.  She has known about mild thrombocytopenia which has developed during her pregnancy in October 2012.  Thrombocytopenia has recurred during her pregnancy in 2014 and patient has continued to do well without any pathological bleeding.  She denies any history of epistaxis axis, gum bleeding, skin petechiae or ecchymosis.  No abnormal vaginal bleeding, hematuria, rectal bleeding, or melena.  Patient denies fevers, chills, night sweats.  No swelling in the neck, armpits, or groin nodes.  Patient denies any nausea, dysphasia, early satiety, abdominal pain, or diarrhea.  Oncological/hematological History: --Labs, 10/15/15: WBC 3.7, ANC 2.0, ALC 1.2, Mono 0.2, Hgb     ..., Plt 102, MPV 11.4 --Labs, 05/16/16: WBC 3.8, ANC 2.4, ALC 1.1, Mono 0.2, Hgb     ..., Plt 105, MPV 12.0 --Labs, 07/23/17: WBC 3.2, ANC 1.4, ALC 1.5, Mono 0.2, Hgb 13.9, Plt 109, MPV 11.6  Medical History: Past Medical History:  Diagnosis Date  . Anxiety   . Asthma    allergy induced  . Chlamydia   . GBS (group B streptococcus) infection 2012   w/ first pregnancy  . Gonorrhea   . Thrombocytopenia (HCC) 2016    Surgical History: Past Surgical History:  Procedure Laterality Date  . INGUINAL HERNIA REPAIR    . WISDOM TOOTH EXTRACTION      Family History: Family History  Problem  Relation Age of Onset  . Hypertension Mother   . Hypertension Father   . Alcohol abuse Maternal Aunt   . Alcohol abuse Maternal Uncle   . Diabetes Paternal Aunt   . Diabetes Paternal Uncle   . Diabetes Maternal Grandmother   . Heart disease Maternal Grandfather   . Cancer Maternal Grandfather        PROSTATE  . Alcohol abuse Maternal Grandfather   . Diabetes Paternal Grandfather   . Alcohol abuse Paternal Grandfather   . Asthma Cousin   . Seizures Cousin   . Migraines Cousin     Social History: Social History   Socioeconomic History   . Marital status: Married    Spouse name: Marcie BalCraig Sponaugle  . Number of children: 1  . Years of education: 6619  . Highest education level: Not on file  Occupational History  . Occupation: Insurance claims handlerLead Administration Manager    [place] [employer]  Social Needs  . Financial resource strain: Not on file  . Food insecurity:    Worry: Not on file    Inability: Not on file  . Transportation needs:    Medical: Not on file    Non-medical: Not on file  Tobacco Use  . Smoking status: Never Smoker  . Smokeless tobacco: Never Used  Substance and Sexual Activity  . Alcohol use: No    Alcohol/week: 0.0 oz  . Drug use: No  . Sexual activity: Yes    Partners: Male    Birth control/protection: Condom    Comment: 1st intercourse 42 yo-More than 5 partners  Lifestyle  . Physical activity:    Days per week: Not on file    Minutes per session: Not on file  . Stress: Not on file  Relationships  . Social connections:    Talks on phone: Not on file    Gets together: Not on file    Attends religious service: Not on file    Active member of club or organization: Not on file    Attends meetings of clubs or organizations: Not on file    Relationship status: Not on file  . Intimate partner violence:    Fear of current or ex partner: Not on file    Emotionally abused: Not on file    Physically abused: Not on file    Forced sexual activity: Not on file  Other Topics Concern  . Not on file  Social History Narrative  . Not on file    Allergies: Allergies  Allergen Reactions  . Promethazine Other (See Comments)    Pt suffers from a stiff neck and can not close mouth after coming in contact with medication.    Medications:  Current Outpatient Medications  Medication Sig Dispense Refill  . ibuprofen (ADVIL,MOTRIN) 600 MG tablet Take 1 tablet (600 mg total) by mouth every 6 (six) hours. 30 tablet 0  . methocarbamol (ROBAXIN) 500 MG tablet Take 500 mg by mouth every 6 (six) hours as needed for  muscle spasms.    . Multiple Vitamin (MULTIVITAMIN) tablet Take 1 tablet by mouth daily.     No current facility-administered medications for this visit.     Review of Systems: Review of Systems  Hematological: Negative for adenopathy. Does not bruise/bleed easily.  All other systems reviewed and are negative.    PHYSICAL EXAMINATION Blood pressure 128/80, pulse 63, temperature 98.5 F (36.9 C), resp. rate 18, height 6\' 1"  (1.854 m), weight 163 lb 12.8 oz (74.3 kg), SpO2 100 %,  unknown if currently breastfeeding.  ECOG PERFORMANCE STATUS: 0 - Asymptomatic  Physical Exam  Constitutional: She is oriented to person, place, and time. She appears well-developed and well-nourished. No distress.  HENT:  Head: Normocephalic and atraumatic.  Mouth/Throat: Oropharynx is clear and moist. No oropharyngeal exudate.  Eyes: Pupils are equal, round, and reactive to light. Conjunctivae and EOM are normal. No scleral icterus.  Neck: No thyromegaly present.  Cardiovascular: Normal rate, regular rhythm, normal heart sounds and intact distal pulses. Exam reveals no gallop and no friction rub.  No murmur heard. Pulmonary/Chest: Effort normal and breath sounds normal. No stridor. No respiratory distress. She has no wheezes. She has no rales.  Abdominal: Soft. Bowel sounds are normal. She exhibits no distension and no mass. There is no tenderness. There is no guarding.  Musculoskeletal: She exhibits no edema.  Lymphadenopathy:    She has no cervical adenopathy.  Neurological: She is alert and oriented to person, place, and time. She displays normal reflexes. No cranial nerve deficit or sensory deficit.  Skin: Skin is warm and dry. No rash noted. She is not diaphoretic. No erythema. No pallor.     LABORATORY DATA: I have personally reviewed the data as listed: No visits with results within 1 Week(s) from this visit.  Latest known visit with results is:  Office Visit on 03/21/2017  Component Date  Value Ref Range Status  . Source: 03/21/2017 VAGINA   Final  . RESULT 03/21/2017    Final   Comment: EPITHELIAL CELLS-PRESENT CLUE CELLS-NONE SEEN YEAST-NONE SEEN TRICHOMONAS-NONE SEEN WBC-FEW BACTERIA-FEW EPITH. CELLS <6 HPF          Daisy Blossom, MD

## 2017-10-15 ENCOUNTER — Ambulatory Visit: Payer: BC Managed Care – PPO | Admitting: Hematology and Oncology

## 2017-10-16 ENCOUNTER — Telehealth: Payer: Self-pay | Admitting: Hematology and Oncology

## 2017-10-16 NOTE — Telephone Encounter (Signed)
Appointments scheduled patient notified per 4/29 sch msg °

## 2017-10-23 ENCOUNTER — Inpatient Hospital Stay: Payer: BC Managed Care – PPO | Attending: Hematology and Oncology | Admitting: Hematology and Oncology

## 2017-10-25 ENCOUNTER — Telehealth: Payer: Self-pay | Admitting: Hematology and Oncology

## 2017-10-25 NOTE — Telephone Encounter (Signed)
Appt rescheduled Letter/calendar mailed to patient per 4/29 sch msg

## 2017-11-05 ENCOUNTER — Inpatient Hospital Stay: Payer: BC Managed Care – PPO | Admitting: Hematology and Oncology

## 2018-04-12 LAB — HM MAMMOGRAPHY

## 2018-04-24 ENCOUNTER — Encounter: Payer: BC Managed Care – PPO | Admitting: Gynecology

## 2019-03-11 ENCOUNTER — Encounter: Payer: Self-pay | Admitting: Gynecology

## 2019-04-07 ENCOUNTER — Encounter: Payer: Self-pay | Admitting: Gynecology

## 2019-08-02 IMAGING — DX DG LUMBAR SPINE 2-3V
3 series · 3 of 3 positions shown · non-contrast
Comparison: None.

CLINICAL DATA: 41-year-old female with low back pain extending down
left leg for 3 weeks. No reported trauma. Initial encounter.

EXAM:
LUMBAR SPINE - 2-3 VIEW

[dg lumbar spine 2-3 views (1 of 3)]
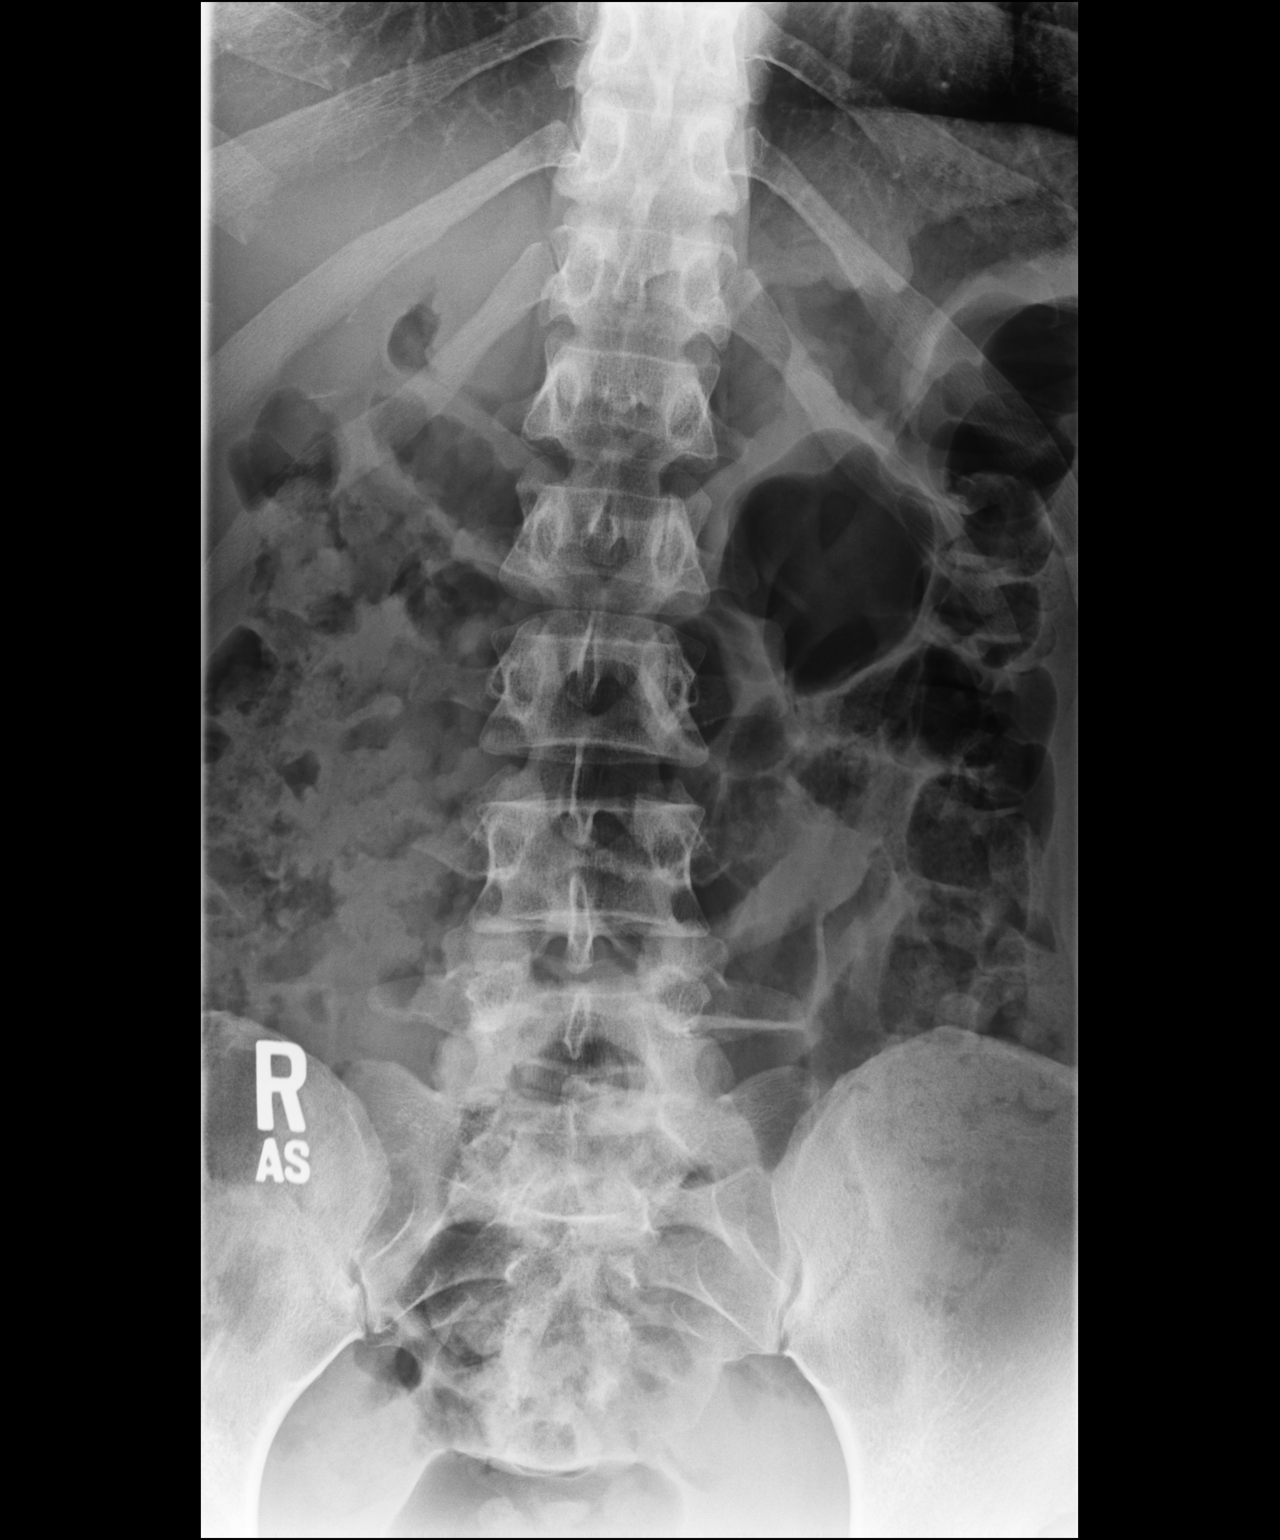

[dg lumbar spine 2-3 views (2 of 3)]
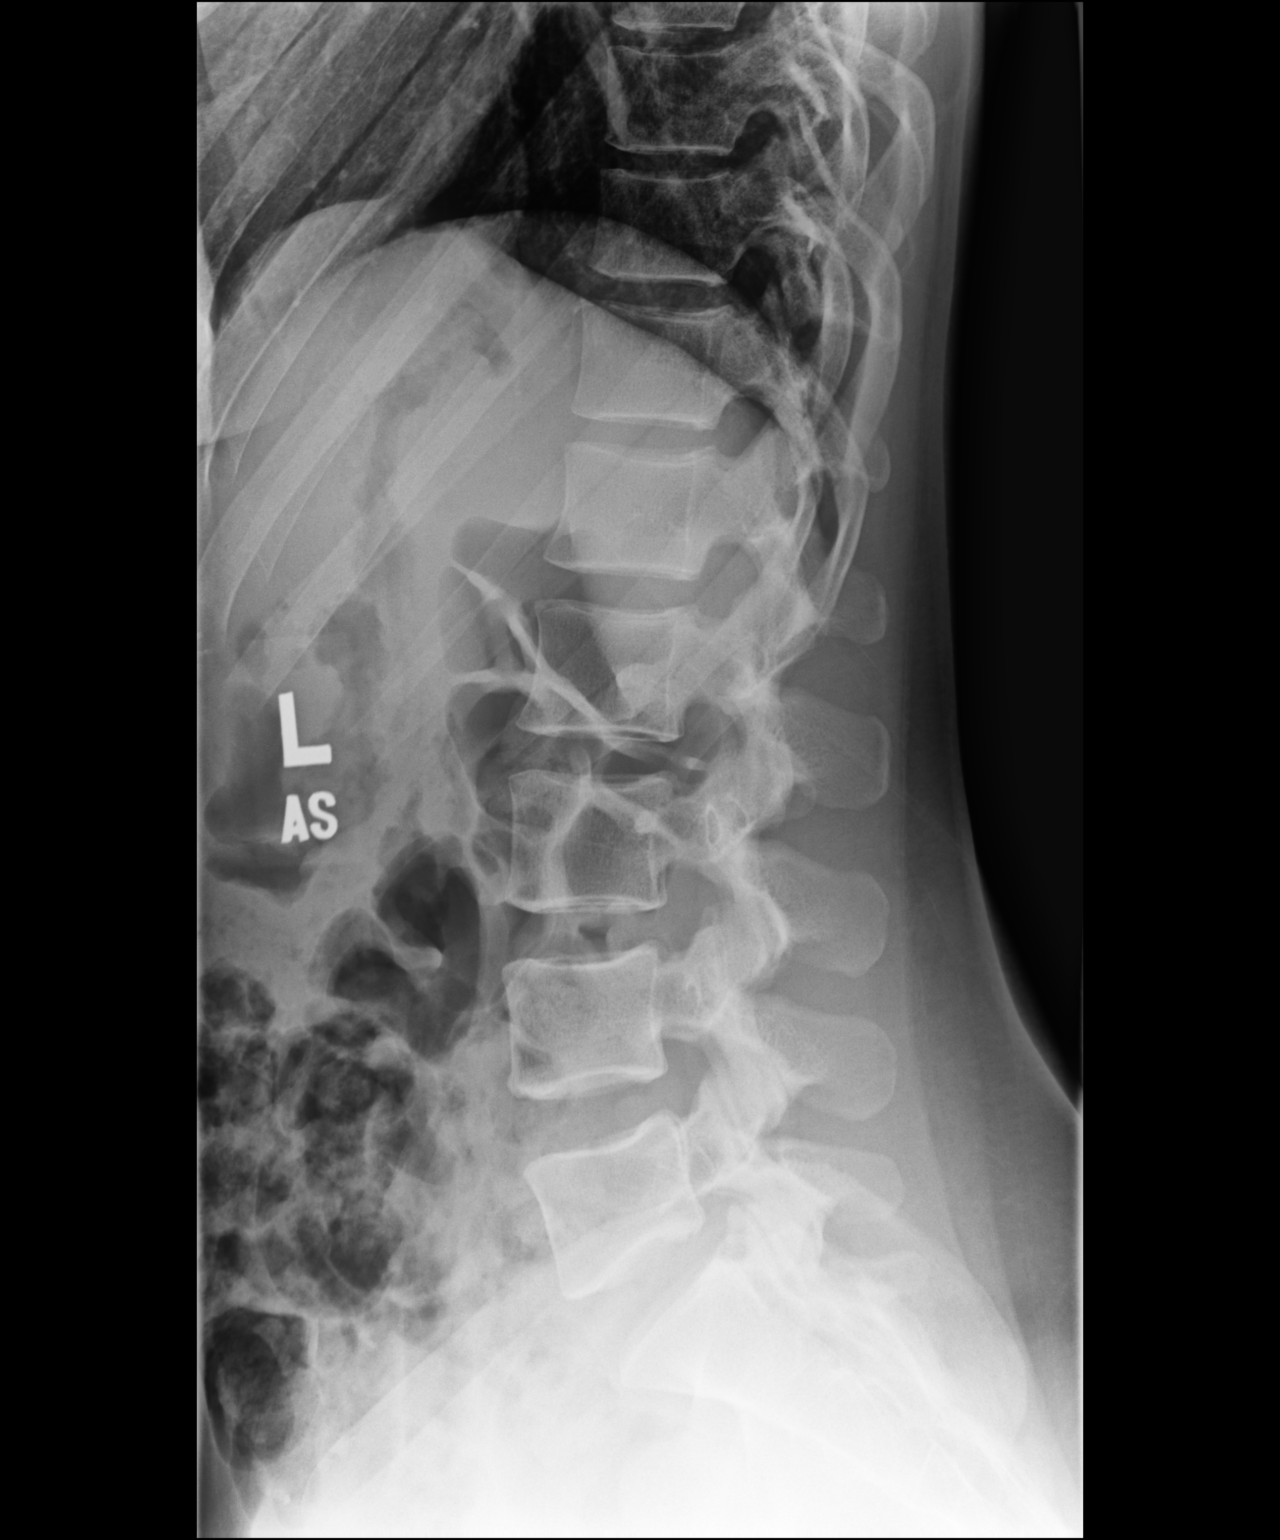

[dg lumbar spine 2-3 views (3 of 3)]
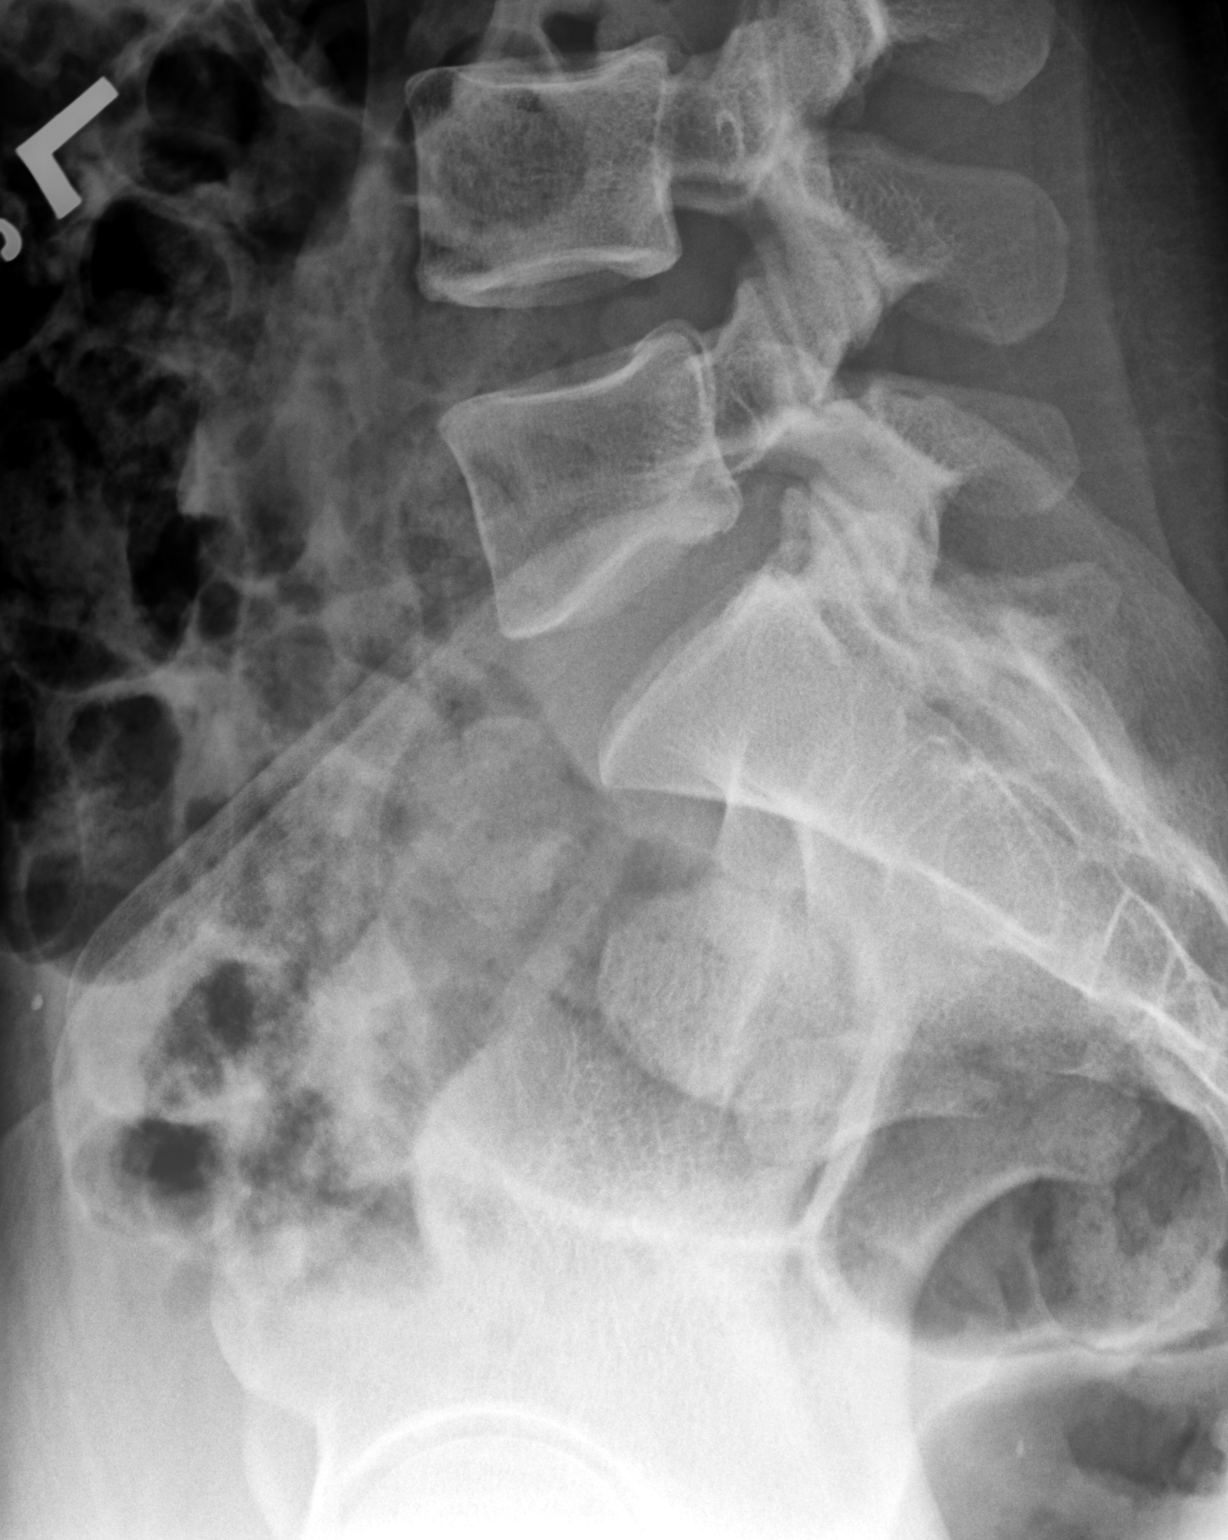

[3 of 3 positions shown; findings below may reference images not displayed]

FINDINGS: Minimal curvature lumbar spine.

No disc space narrowing.

No compression fracture.
IMPRESSION: Negative.

## 2021-01-13 ENCOUNTER — Telehealth: Payer: Self-pay | Admitting: *Deleted

## 2021-01-13 NOTE — Telephone Encounter (Signed)
Per referral - called and gave upcoming appointments - confirmed - mailed welcome packet with calendar 

## 2021-02-22 ENCOUNTER — Other Ambulatory Visit: Payer: Self-pay

## 2021-02-22 ENCOUNTER — Inpatient Hospital Stay: Payer: BC Managed Care – PPO | Attending: Family

## 2021-02-22 ENCOUNTER — Telehealth: Payer: Self-pay | Admitting: *Deleted

## 2021-02-22 ENCOUNTER — Inpatient Hospital Stay (HOSPITAL_BASED_OUTPATIENT_CLINIC_OR_DEPARTMENT_OTHER): Payer: BC Managed Care – PPO | Admitting: Family

## 2021-02-22 ENCOUNTER — Other Ambulatory Visit: Payer: Self-pay | Admitting: Family

## 2021-02-22 VITALS — BP 119/74 | HR 56 | Temp 98.3°F | Resp 18 | Ht 73.0 in | Wt 171.1 lb

## 2021-02-22 DIAGNOSIS — D72819 Decreased white blood cell count, unspecified: Secondary | ICD-10-CM | POA: Diagnosis not present

## 2021-02-22 DIAGNOSIS — D696 Thrombocytopenia, unspecified: Secondary | ICD-10-CM | POA: Diagnosis not present

## 2021-02-22 DIAGNOSIS — Z79899 Other long term (current) drug therapy: Secondary | ICD-10-CM | POA: Diagnosis not present

## 2021-02-22 LAB — CMP (CANCER CENTER ONLY)
ALT: 12 U/L (ref 0–44)
AST: 22 U/L (ref 15–41)
Albumin: 3.6 g/dL (ref 3.5–5.0)
Alkaline Phosphatase: 60 U/L (ref 38–126)
Anion gap: 5 (ref 5–15)
BUN: 13 mg/dL (ref 6–20)
CO2: 26 mmol/L (ref 22–32)
Calcium: 8.7 mg/dL — ABNORMAL LOW (ref 8.9–10.3)
Chloride: 106 mmol/L (ref 98–111)
Creatinine: 0.84 mg/dL (ref 0.44–1.00)
GFR, Estimated: >60 mL/min (ref >60)
Glucose, Bld: 79 mg/dL (ref 70–99)
Potassium: 4.1 mmol/L (ref 3.5–5.1)
Sodium: 137 mmol/L (ref 135–145)
Total Bilirubin: 0.7 mg/dL (ref 0.3–1.2)
Total Protein: 6.7 g/dL (ref 6.5–8.1)

## 2021-02-22 LAB — CBC WITH DIFFERENTIAL (CANCER CENTER ONLY)
Abs Immature Granulocytes: 0 10*3/uL (ref 0.00–0.07)
Basophils Absolute: 0 10*3/uL (ref 0.0–0.1)
Basophils Relative: 1 %
Eosinophils Absolute: 0.2 10*3/uL (ref 0.0–0.5)
Eosinophils Relative: 7 %
HCT: 38.3 % (ref 36.0–46.0)
Hemoglobin: 12.6 g/dL (ref 12.0–15.0)
Immature Granulocytes: 0 %
Lymphocytes Relative: 51 %
Lymphs Abs: 1.4 10*3/uL (ref 0.7–4.0)
MCH: 29.6 pg (ref 26.0–34.0)
MCHC: 32.9 g/dL (ref 30.0–36.0)
MCV: 90.1 fL (ref 80.0–100.0)
Monocytes Absolute: 0.2 10*3/uL (ref 0.1–1.0)
Monocytes Relative: 8 %
Neutro Abs: 0.9 10*3/uL — ABNORMAL LOW (ref 1.7–7.7)
Neutrophils Relative %: 33 %
Platelet Count: 110 10*3/uL — ABNORMAL LOW (ref 150–400)
RBC: 4.25 MIL/uL (ref 3.87–5.11)
RDW: 14 % (ref 11.5–15.5)
WBC Count: 2.8 10*3/uL — ABNORMAL LOW (ref 4.0–10.5)
nRBC: 0 % (ref 0.0–0.2)

## 2021-02-22 LAB — SAVE SMEAR(SSMR), FOR PROVIDER SLIDE REVIEW

## 2021-02-22 LAB — LACTATE DEHYDROGENASE: LDH: 147 U/L (ref 98–192)

## 2021-02-22 NOTE — Progress Notes (Signed)
Hematology/Oncology Consultation   Name: Melanie House      MRN: [id number]    Location: Room/bed info not found  Date: 02/22/2021 Time:8:39 AM   REFERRING PHYSICIAN:  Tally Joe, MD  REASON FOR CONSULT: Leukopenia and thrombocytopenia    DIAGNOSIS: Leukopenia and thrombocytopenia   HISTORY OF PRESENT ILLNESS: Ms. Mackert is a very pleasant 45 yo African American female with a 10 year history of mild thrombocytopenia and 6 year history of mild intermittent leukopenia.  WBC count is stable at 2.8, Hgb 12.6, MCV 90 and platelets 110.  She remains asymptomatic.  She has had no issue with frequent or recurrent infections.  Her cycle is regular and mildly heavy at times. No other blood loss noted.  She has 2 daughters and no history of miscarriage.  No abnormal bruising, no petechiae.   No known family history of thrombocytopenia or leukopenia.  No known sickle cell disease or trait.  No history of liver disease. Spleen intact.  No history of diabetes or thyroid disease.  No personal history of cancer.  Family history includes maternal aunt - breast, maternal uncle - unknown primary and maternal grandfather - prostate. She states that her mammogram is due again in October.  No fever, chills, n/v, cough, rash, dizziness, SOB, chest pain, palpitations, abdominal pain or changes in bowel or bladder habits.  No swelling, tenderness, numbness or tingling in her extremities at this time.  She states that when she doesn't work out or has her cycle she will get neck pain and associated tingling in her fingers. This resolves with stretching and working out which she does 2-3 days a week.  No falls or syncope to report.  She has maintained a good appetite. She only eats fish/seafood. No red meat, chicken or pork. She feels that she stays well hydrated throughout the day.  No smoking, ETOH or recreational drug use.  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   ROS: All other 10 point review of systems is negative.   PAST MEDICAL HISTORY:   Past Medical History:  Diagnosis Date   Anxiety    Asthma    allergy induced   Chlamydia    GBS (group B streptococcus) infection 2012   w/ first pregnancy   Gonorrhea    Thrombocytopenia (HCC) 2016    ALLERGIES: Allergies  Allergen Reactions   Promethazine Other (See Comments)    Pt suffers from a stiff neck and can not close mouth after coming in contact with medication.      MEDICATIONS:  Current Outpatient Medications on File Prior to Visit  Medication Sig Dispense Refill   ibuprofen (ADVIL,MOTRIN) 600 MG tablet Take 1 tablet (600 mg total) by mouth every 6 (six) hours. 30 tablet 0   methocarbamol (ROBAXIN) 500 MG tablet Take 500 mg by mouth every 6 (six) hours as needed for muscle spasms.     Multiple Vitamin (MULTIVITAMIN) tablet Take 1 tablet by mouth daily.     No current facility-administered medications on file prior to visit.     PAST SURGICAL HISTORY Past Surgical History:  Procedure Laterality Date   INGUINAL HERNIA REPAIR     WISDOM TOOTH EXTRACTION      FAMILY HISTORY: Family History  Problem Relation Age of Onset   Hypertension Mother    Hypertension Father    Alcohol abuse Maternal Aunt    Alcohol abuse Maternal Uncle  Diabetes Paternal Aunt    Diabetes Paternal Uncle    Diabetes Maternal Grandmother    Heart disease Maternal Grandfather    Cancer Maternal Grandfather        PROSTATE   Alcohol abuse Maternal Grandfather    Diabetes Paternal Grandfather    Alcohol abuse Paternal Grandfather    Asthma Cousin    Seizures Cousin    Migraines Cousin     SOCIAL HISTORY:  reports that she has never smoked. She has never used smokeless tobacco. She reports that she does not drink alcohol and does not use drugs.  PERFORMANCE STATUS: The patient's performance status is 0 - Asymptomatic  PHYSICAL EXAM: Most Recent Vital Signs:  unknown if currently breastfeeding. There were no vitals taken for this visit.  General Appearance:    Alert, cooperative, no distress, appears stated age  Head:    Normocephalic, without obvious abnormality, atraumatic  Eyes:    PERRL, conjunctiva/corneas clear, EOM's intact, fundi    benign, both eyes        Throat:   Lips, mucosa, and tongue normal; teeth and gums normal  Neck:   Supple, symmetrical, trachea midline, no adenopathy;    thyroid:  no enlargement/tenderness/nodules; no carotid   bruit or JVD  Back:     Symmetric, no curvature, ROM normal, no CVA tenderness  Lungs:     Clear to auscultation bilaterally, respirations unlabored  Chest Wall:    No tenderness or deformity   Heart:    Regular rate and rhythm, S1 and S2 normal, no murmur, rub   or gallop     Abdomen:     Soft, non-tender, bowel sounds active all four quadrants,    no masses, no organomegaly        Extremities:   Extremities normal, atraumatic, no cyanosis or edema  Pulses:   2+ and symmetric all extremities  Skin:   Skin color, texture, turgor normal, no rashes or lesions  Lymph nodes:   Cervical, supraclavicular, and axillary nodes normal  Neurologic:   CNII-XII intact, normal strength, sensation and reflexes    throughout    LABORATORY DATA:  Results for orders placed or performed in visit on 02/22/21 (from the past 48 hour(s))  CBC with Differential (Cancer Center Only)     Status: Abnormal   Collection Time: 02/22/21  8:25 AM  Result Value Ref Range   WBC Count 2.8 (L) 4.0 - 10.5 K/uL   RBC 4.25 3.87 - 5.11 MIL/uL   Hemoglobin 12.6 12.0 - 15.0 g/dL   HCT 35.5 73.2 - 20.2 %   MCV 90.1 80.0 - 100.0 fL   MCH 29.6 26.0 - 34.0 pg   MCHC 32.9 30.0 - 36.0 g/dL   RDW 54.2 70.6 - 23.7 %   Platelet Count 110 (L) 150 - 400 K/uL   nRBC 0.0 0.0 - 0.2 %   Neutrophils Relative % 33 %   Neutro Abs 0.9 (L) 1.7 - 7.7 K/uL   Lymphocytes Relative 51 %   Lymphs Abs 1.4 0.7 - 4.0 K/uL   Monocytes Relative 8 %    Monocytes Absolute 0.2 0.1 - 1.0 K/uL   Eosinophils Relative 7 %   Eosinophils Absolute 0.2 0.0 - 0.5 K/uL   Basophils Relative 1 %   Basophils Absolute 0.0 0.0 - 0.1 K/uL   Immature Granulocytes 0 %   Abs Immature Granulocytes 0.00 0.00 - 0.07 K/uL    Comment: Performed at [facility] at [facility]  [address] [place], [place] [address]  [facility])     Status: None   Collection Time: 02/22/21  8:25 AM  Result Value Ref Range   Smear Review SMEAR STAINED AND AVAILABLE FOR REVIEW     Comment: Performed at [facility] at [facility], [address] [place], [place] [address]      RADIOGRAPHY: No results found.     PATHOLOGY: None  ASSESSMENT/PLAN: Ms. 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  Her counts are stable and she has remained asymptomatic.  Blood work and smear reviewed with Dr. Myna Hidalgo. No abnormality or evidence of malignancy noted. Cells appear well developed. No smudge cells, hyper segmented polys or nucleated red blood cells.  At this time we will release her back to her PCP for follow-up.   All questions were answered. The patient knows to call the clinic with any problems, questions or concerns. We can certainly see the patient again for any future hem/onc issues that may occur.   The patient was discussed with Dr. Myna Hidalgo and he is in agreement with the aforementioned.   Emeline Gins, NP

## 2021-02-22 NOTE — Telephone Encounter (Signed)
Per 02/22/21 los - no follow up is needed
